# Patient Record
Sex: Male | Born: 1945 | Race: White | Hispanic: No | Marital: Married | Smoking: Former smoker
Health system: Southern US, Community
[De-identification: ages and names within clinical notes are randomized; demographics above are authoritative.]

## PROBLEM LIST (undated history)

## (undated) DIAGNOSIS — I10 Essential (primary) hypertension: Secondary | ICD-10-CM

## (undated) DIAGNOSIS — I509 Heart failure, unspecified: Secondary | ICD-10-CM

## (undated) DIAGNOSIS — I209 Angina pectoris, unspecified: Secondary | ICD-10-CM

## (undated) DIAGNOSIS — G709 Myoneural disorder, unspecified: Secondary | ICD-10-CM

## (undated) DIAGNOSIS — I219 Acute myocardial infarction, unspecified: Secondary | ICD-10-CM

## (undated) DIAGNOSIS — I2510 Atherosclerotic heart disease of native coronary artery without angina pectoris: Secondary | ICD-10-CM

## (undated) DIAGNOSIS — E119 Type 2 diabetes mellitus without complications: Secondary | ICD-10-CM

## (undated) HISTORY — PX: OTHER SURGICAL HISTORY: SHX169

## (undated) HISTORY — PX: CORONARY ANGIOPLASTY: SHX604

## (undated) HISTORY — PX: TOE AMPUTATION: SHX809

## (undated) HISTORY — PX: UPPER GI ENDOSCOPY: SHX6162

## (undated) HISTORY — PX: TONSILLECTOMY: SUR1361

---

## 2013-10-04 ENCOUNTER — Other Ambulatory Visit: Payer: Self-pay | Admitting: Podiatry

## 2013-10-04 ENCOUNTER — Inpatient Hospital Stay: Payer: Self-pay | Admitting: Specialist

## 2013-10-04 LAB — COMPREHENSIVE METABOLIC PANEL
Albumin: 3.1 g/dL — ABNORMAL LOW (ref 3.4–5.0)
Anion Gap: 10 (ref 7–16)
BUN: 27 mg/dL — ABNORMAL HIGH (ref 7–18)
EGFR (African American): 51 — ABNORMAL LOW
EGFR (Non-African Amer.): 44 — ABNORMAL LOW
Glucose: 129 mg/dL — ABNORMAL HIGH (ref 65–99)
Osmolality: 273 (ref 275–301)
SGOT(AST): 33 U/L (ref 15–37)
SGPT (ALT): 31 U/L (ref 12–78)
Sodium: 133 mmol/L — ABNORMAL LOW (ref 136–145)
Total Protein: 7.5 g/dL (ref 6.4–8.2)

## 2013-10-04 LAB — CBC WITH DIFFERENTIAL/PLATELET
Basophil #: 0 10*3/uL (ref 0.0–0.1)
Basophil %: 0.2 %
Eosinophil #: 0 10*3/uL (ref 0.0–0.7)
Eosinophil %: 0 %
HGB: 13 g/dL (ref 13.0–18.0)
Lymphocyte %: 3.4 %
MCH: 28.1 pg (ref 26.0–34.0)
MCHC: 33.4 g/dL (ref 32.0–36.0)
MCV: 84 fL (ref 80–100)
Neutrophil %: 94.7 %
Platelet: 288 10*3/uL (ref 150–440)
RBC: 4.61 10*6/uL (ref 4.40–5.90)

## 2013-10-05 LAB — BASIC METABOLIC PANEL
Anion Gap: 4 — ABNORMAL LOW (ref 7–16)
BUN: 34 mg/dL — ABNORMAL HIGH (ref 7–18)
Calcium, Total: 8.1 mg/dL — ABNORMAL LOW (ref 8.5–10.1)
Chloride: 103 mmol/L (ref 98–107)
Creatinine: 2.25 mg/dL — ABNORMAL HIGH (ref 0.60–1.30)
Creatinine: 1.62 mg/dL — ABNORMAL HIGH (ref 0.60–1.30)
EGFR (African American): 34 — ABNORMAL LOW
EGFR (African American): 51 — ABNORMAL LOW
EGFR (Non-African Amer.): 29 — ABNORMAL LOW
Glucose: 134 mg/dL — ABNORMAL HIGH (ref 65–99)
Osmolality: 267 (ref 275–301)
Osmolality: 276 (ref 275–301)
Potassium: 3.7 mmol/L (ref 3.5–5.1)
Potassium: 3.7 mmol/L (ref 3.5–5.1)
Sodium: 128 mmol/L — ABNORMAL LOW (ref 136–145)
Sodium: 133 mmol/L — ABNORMAL LOW (ref 136–145)

## 2013-10-05 LAB — HEMOGLOBIN A1C: Hemoglobin A1C: 6 % (ref 4.2–6.3)

## 2013-10-05 LAB — CBC WITH DIFFERENTIAL/PLATELET
Eosinophil #: 0 10*3/uL (ref 0.0–0.7)
Eosinophil %: 0 %
HCT: 33.1 % — ABNORMAL LOW (ref 40.0–52.0)
HGB: 10.9 g/dL — ABNORMAL LOW (ref 13.0–18.0)
Lymphocyte #: 0.6 10*3/uL — ABNORMAL LOW (ref 1.0–3.6)
Lymphocyte %: 4.1 %
MCH: 27.6 pg (ref 26.0–34.0)
MCV: 83 fL (ref 80–100)
Monocyte %: 2 %
Neutrophil #: 12.6 10*3/uL — ABNORMAL HIGH (ref 1.4–6.5)
Neutrophil %: 93.5 %
Platelet: 238 10*3/uL (ref 150–440)
RDW: 15.4 % — ABNORMAL HIGH (ref 11.5–14.5)

## 2013-10-05 LAB — LIPID PANEL
Triglycerides: 96 mg/dL (ref 0–200)
VLDL Cholesterol, Calc: 19 mg/dL (ref 5–40)

## 2013-10-06 DIAGNOSIS — R079 Chest pain, unspecified: Secondary | ICD-10-CM

## 2013-10-06 LAB — VANCOMYCIN, TROUGH: Vancomycin, Trough: 12 ug/mL (ref 10–20)

## 2013-10-06 LAB — TROPONIN I
Troponin-I: 0.02 ng/mL
Troponin-I: 0.16 ng/mL — ABNORMAL HIGH
Troponin-I: 0.22 ng/mL — ABNORMAL HIGH
Troponin-I: 0.14 ng/mL — ABNORMAL HIGH

## 2013-10-06 LAB — CBC WITH DIFFERENTIAL/PLATELET
Basophil #: 0 10*3/uL (ref 0.0–0.1)
Basophil %: 0.2 %
Eosinophil #: 0 10*3/uL (ref 0.0–0.7)
Eosinophil %: 0 %
HCT: 33.5 % — ABNORMAL LOW (ref 40.0–52.0)
HGB: 11.4 g/dL — ABNORMAL LOW (ref 13.0–18.0)
Lymphocyte #: 0.7 10*3/uL — ABNORMAL LOW (ref 1.0–3.6)
Lymphocyte %: 8.8 %
MCH: 28 pg (ref 26.0–34.0)
MCHC: 34 g/dL (ref 32.0–36.0)
MCV: 83 fL (ref 80–100)
Monocyte #: 0.4 x10 3/mm (ref 0.2–1.0)
Monocyte %: 5 %
Neutrophil #: 6.7 10*3/uL — ABNORMAL HIGH (ref 1.4–6.5)
Neutrophil %: 86 %
Platelet: 208 10*3/uL (ref 150–440)
RBC: 4.07 10*6/uL — ABNORMAL LOW (ref 4.40–5.90)
RDW: 15.3 % — ABNORMAL HIGH (ref 11.5–14.5)
WBC: 7.8 10*3/uL (ref 3.8–10.6)

## 2013-10-06 LAB — COMPREHENSIVE METABOLIC PANEL
Albumin: 2.1 g/dL — ABNORMAL LOW (ref 3.4–5.0)
Alkaline Phosphatase: 105 U/L
Anion Gap: 6 — ABNORMAL LOW (ref 7–16)
BUN: 26 mg/dL — ABNORMAL HIGH (ref 7–18)
Bilirubin,Total: 0.7 mg/dL (ref 0.2–1.0)
Calcium, Total: 8.1 mg/dL — ABNORMAL LOW (ref 8.5–10.1)
Chloride: 103 mmol/L (ref 98–107)
Co2: 26 mmol/L (ref 21–32)
Creatinine: 1.5 mg/dL — ABNORMAL HIGH (ref 0.60–1.30)
EGFR (African American): 55 — ABNORMAL LOW
EGFR (Non-African Amer.): 48 — ABNORMAL LOW
Glucose: 181 mg/dL — ABNORMAL HIGH (ref 65–99)
Osmolality: 279 (ref 275–301)
Potassium: 3.7 mmol/L (ref 3.5–5.1)
SGOT(AST): 59 U/L — ABNORMAL HIGH (ref 15–37)
SGPT (ALT): 47 U/L (ref 12–78)
Sodium: 135 mmol/L — ABNORMAL LOW (ref 136–145)
Total Protein: 6.2 g/dL — ABNORMAL LOW (ref 6.4–8.2)

## 2013-10-07 LAB — CBC WITH DIFFERENTIAL/PLATELET
Basophil #: 0 10*3/uL (ref 0.0–0.1)
Basophil %: 0.3 %
Eosinophil #: 0 10*3/uL (ref 0.0–0.7)
Eosinophil %: 0.1 %
HCT: 32.9 % — AB (ref 40.0–52.0)
HGB: 10.9 g/dL — ABNORMAL LOW (ref 13.0–18.0)
LYMPHS ABS: 2 10*3/uL (ref 1.0–3.6)
Lymphocyte %: 16.2 %
MCH: 27.2 pg (ref 26.0–34.0)
MCHC: 33.3 g/dL (ref 32.0–36.0)
MCV: 82 fL (ref 80–100)
MONOS PCT: 5.7 %
Monocyte #: 0.7 x10 3/mm (ref 0.2–1.0)
NEUTROS ABS: 9.4 10*3/uL — AB (ref 1.4–6.5)
NEUTROS PCT: 77.7 %
PLATELETS: 222 10*3/uL (ref 150–440)
RBC: 4.02 10*6/uL — ABNORMAL LOW (ref 4.40–5.90)
RDW: 15.6 % — ABNORMAL HIGH (ref 11.5–14.5)
WBC: 12.1 10*3/uL — AB (ref 3.8–10.6)

## 2013-10-07 LAB — BASIC METABOLIC PANEL
Anion Gap: 5 — ABNORMAL LOW (ref 7–16)
BUN: 30 mg/dL — ABNORMAL HIGH (ref 7–18)
CREATININE: 1.44 mg/dL — AB (ref 0.60–1.30)
Calcium, Total: 8.7 mg/dL (ref 8.5–10.1)
Chloride: 102 mmol/L (ref 98–107)
Co2: 30 mmol/L (ref 21–32)
EGFR (African American): 58 — ABNORMAL LOW
GFR CALC NON AF AMER: 50 — AB
Glucose: 100 mg/dL — ABNORMAL HIGH (ref 65–99)
Osmolality: 280 (ref 275–301)
Potassium: 3.5 mmol/L (ref 3.5–5.1)
SODIUM: 137 mmol/L (ref 136–145)

## 2013-10-07 LAB — PROTIME-INR
INR: 1.1
Prothrombin Time: 14.1 secs (ref 11.5–14.7)

## 2013-10-08 LAB — CBC WITH DIFFERENTIAL/PLATELET
Basophil #: 0.1 10*3/uL (ref 0.0–0.1)
Basophil %: 0.7 %
EOS ABS: 0.1 10*3/uL (ref 0.0–0.7)
EOS PCT: 0.9 %
HCT: 34.9 % — ABNORMAL LOW (ref 40.0–52.0)
HGB: 11.4 g/dL — AB (ref 13.0–18.0)
LYMPHS PCT: 20.1 %
Lymphocyte #: 2.1 10*3/uL (ref 1.0–3.6)
MCH: 27.2 pg (ref 26.0–34.0)
MCHC: 32.8 g/dL (ref 32.0–36.0)
MCV: 83 fL (ref 80–100)
MONO ABS: 0.9 x10 3/mm (ref 0.2–1.0)
MONOS PCT: 8 %
NEUTROS ABS: 7.4 10*3/uL — AB (ref 1.4–6.5)
NEUTROS PCT: 70.3 %
Platelet: 211 10*3/uL (ref 150–440)
RBC: 4.21 10*6/uL — ABNORMAL LOW (ref 4.40–5.90)
RDW: 15.3 % — AB (ref 11.5–14.5)
WBC: 10.6 10*3/uL (ref 3.8–10.6)

## 2013-10-08 LAB — BASIC METABOLIC PANEL
Anion Gap: 6 — ABNORMAL LOW (ref 7–16)
BUN: 27 mg/dL — AB (ref 7–18)
CO2: 28 mmol/L (ref 21–32)
Calcium, Total: 7.9 mg/dL — ABNORMAL LOW (ref 8.5–10.1)
Chloride: 105 mmol/L (ref 98–107)
Creatinine: 1.01 mg/dL (ref 0.60–1.30)
EGFR (African American): >60
EGFR (Non-African Amer.): >60
Glucose: 146 mg/dL — ABNORMAL HIGH (ref 65–99)
OSMOLALITY: 285 (ref 275–301)
Potassium: 3.4 mmol/L — ABNORMAL LOW (ref 3.5–5.1)
Sodium: 139 mmol/L (ref 136–145)

## 2013-10-08 LAB — CK: CK, TOTAL: 65 U/L (ref 35–232)

## 2013-10-09 LAB — CBC WITH DIFFERENTIAL/PLATELET
Basophil #: 0.1 10*3/uL (ref 0.0–0.1)
Basophil %: 0.6 %
Eosinophil #: 0.3 10*3/uL (ref 0.0–0.7)
Eosinophil %: 2.4 %
HCT: 37.2 % — ABNORMAL LOW (ref 40.0–52.0)
HGB: 12.4 g/dL — AB (ref 13.0–18.0)
LYMPHS PCT: 30.3 %
Lymphocyte #: 3.2 10*3/uL (ref 1.0–3.6)
MCH: 27.6 pg (ref 26.0–34.0)
MCHC: 33.4 g/dL (ref 32.0–36.0)
MCV: 83 fL (ref 80–100)
MONOS PCT: 10.3 %
Monocyte #: 1.1 x10 3/mm — ABNORMAL HIGH (ref 0.2–1.0)
Neutrophil #: 6 10*3/uL (ref 1.4–6.5)
Neutrophil %: 56.4 %
Platelet: 248 10*3/uL (ref 150–440)
RBC: 4.5 10*6/uL (ref 4.40–5.90)
RDW: 15.4 % — AB (ref 11.5–14.5)
WBC: 10.7 10*3/uL — ABNORMAL HIGH (ref 3.8–10.6)

## 2013-10-09 LAB — BASIC METABOLIC PANEL
Anion Gap: 5 — ABNORMAL LOW (ref 7–16)
BUN: 23 mg/dL — AB (ref 7–18)
CHLORIDE: 103 mmol/L (ref 98–107)
Calcium, Total: 8.9 mg/dL (ref 8.5–10.1)
Co2: 30 mmol/L (ref 21–32)
Creatinine: 1.18 mg/dL (ref 0.60–1.30)
EGFR (Non-African Amer.): >60
Glucose: 137 mg/dL — ABNORMAL HIGH (ref 65–99)
OSMOLALITY: 282 (ref 275–301)
Potassium: 3.9 mmol/L (ref 3.5–5.1)
SODIUM: 138 mmol/L (ref 136–145)

## 2013-10-09 LAB — CULTURE, BLOOD (SINGLE)

## 2013-10-10 LAB — PATHOLOGY REPORT

## 2013-10-10 LAB — CULTURE, BLOOD (SINGLE)

## 2013-10-11 LAB — CULTURE, BLOOD (SINGLE)

## 2013-10-12 LAB — WOUND CULTURE

## 2013-10-13 ENCOUNTER — Encounter: Payer: Self-pay | Admitting: Surgery

## 2013-10-18 LAB — WOUND CULTURE

## 2014-11-26 IMAGING — CR DG CHEST 1V PORT
1 series · 1 of 1 positions shown · non-contrast
Comparison: None.

CLINICAL DATA: Central line placement.

EXAM:
PORTABLE CHEST - 1 VIEW

[ap]
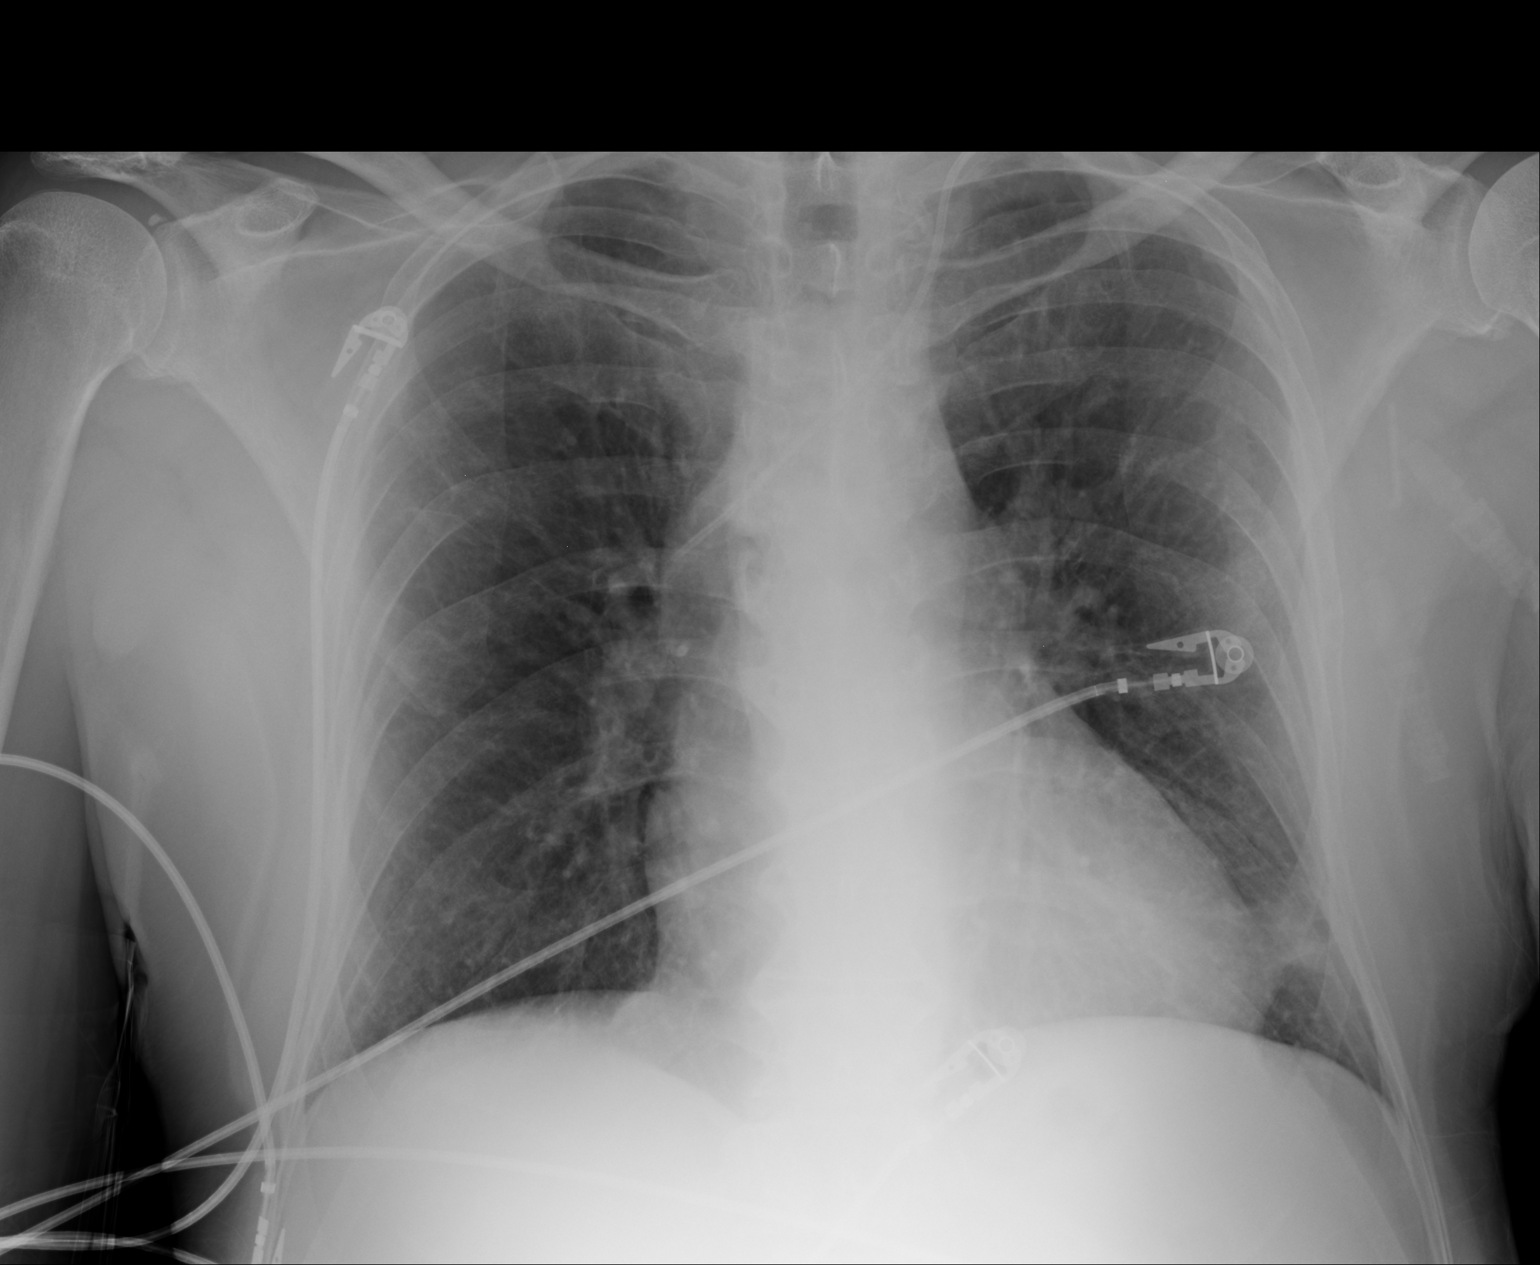

[1 of 1 positions shown; findings below may reference images not displayed]

FINDINGS: Left internal jugular catheter placed with tip overlying the mid SVC
region. Tip is directed somewhat laterally towards the vessel side
wall. No pneumothorax. The heart size and mediastinal contours are
within normal limits. Focal area of atelectasis or infiltration in
the left lung base. . The visualized skeletal structures are
unremarkable.
IMPRESSION: Left central venous catheter placed with tip over the upper SVC
region. Atelectasis or focal infiltration in the left lung base. No
pneumothorax.

## 2014-11-26 IMAGING — CR RIGHT FOOT COMPLETE - 3+ VIEW
1 series · 3 of 3 positions shown · non-contrast
Comparison: None.

CLINICAL DATA: Cellulitis of foot at 5th metatarsal. Pain,
swelling.

EXAM:
RIGHT FOOT COMPLETE - 3+ VIEW

[Series 1: ap · 0.17mm/px · 3 of 3 slices shown]
[im 1/3]
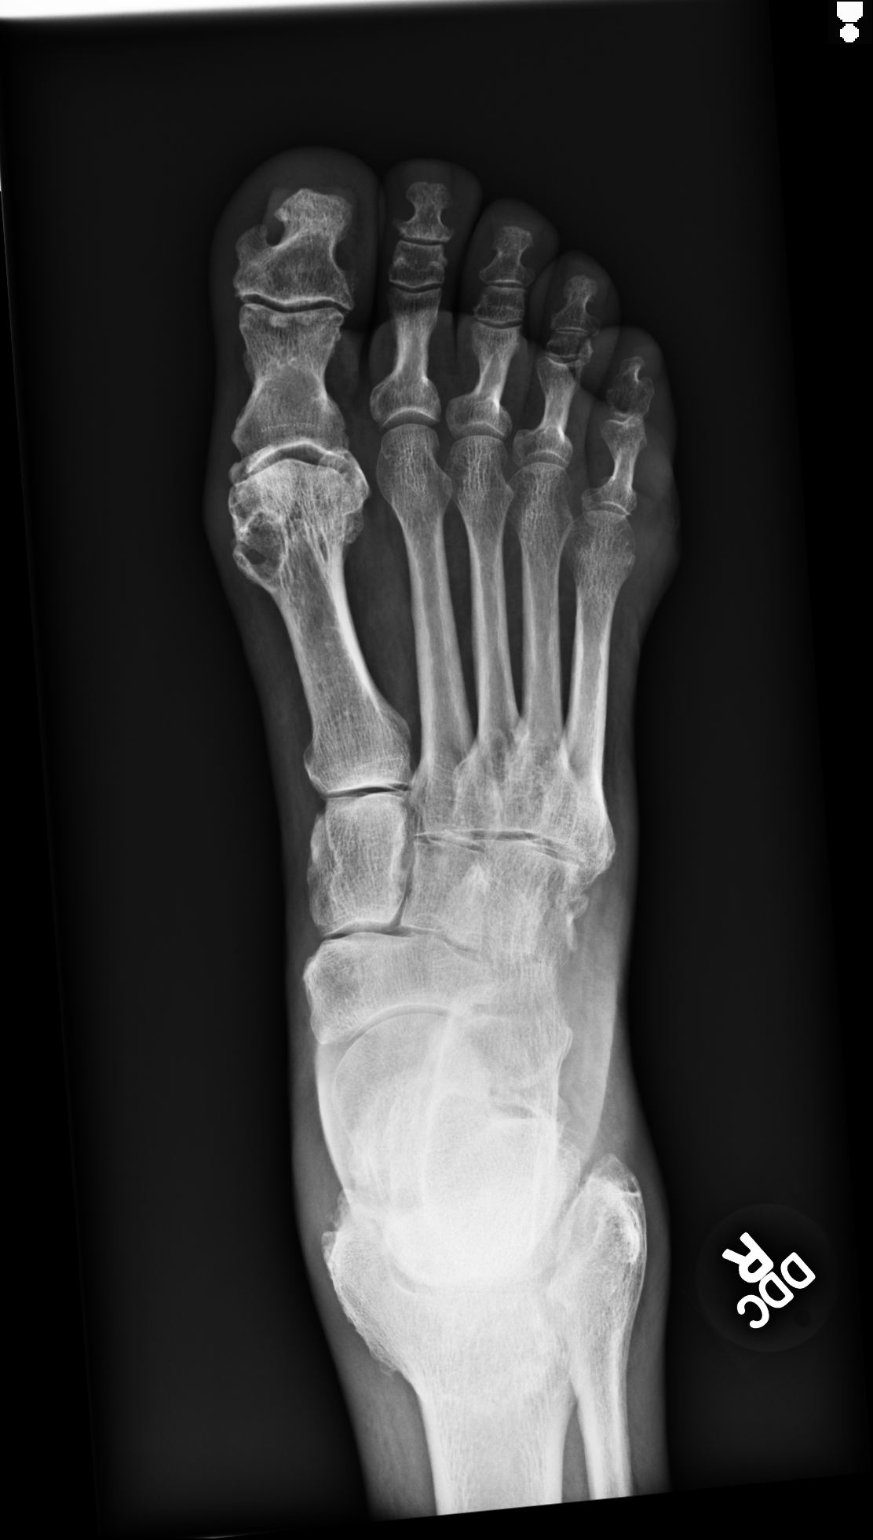
[im 2/3]
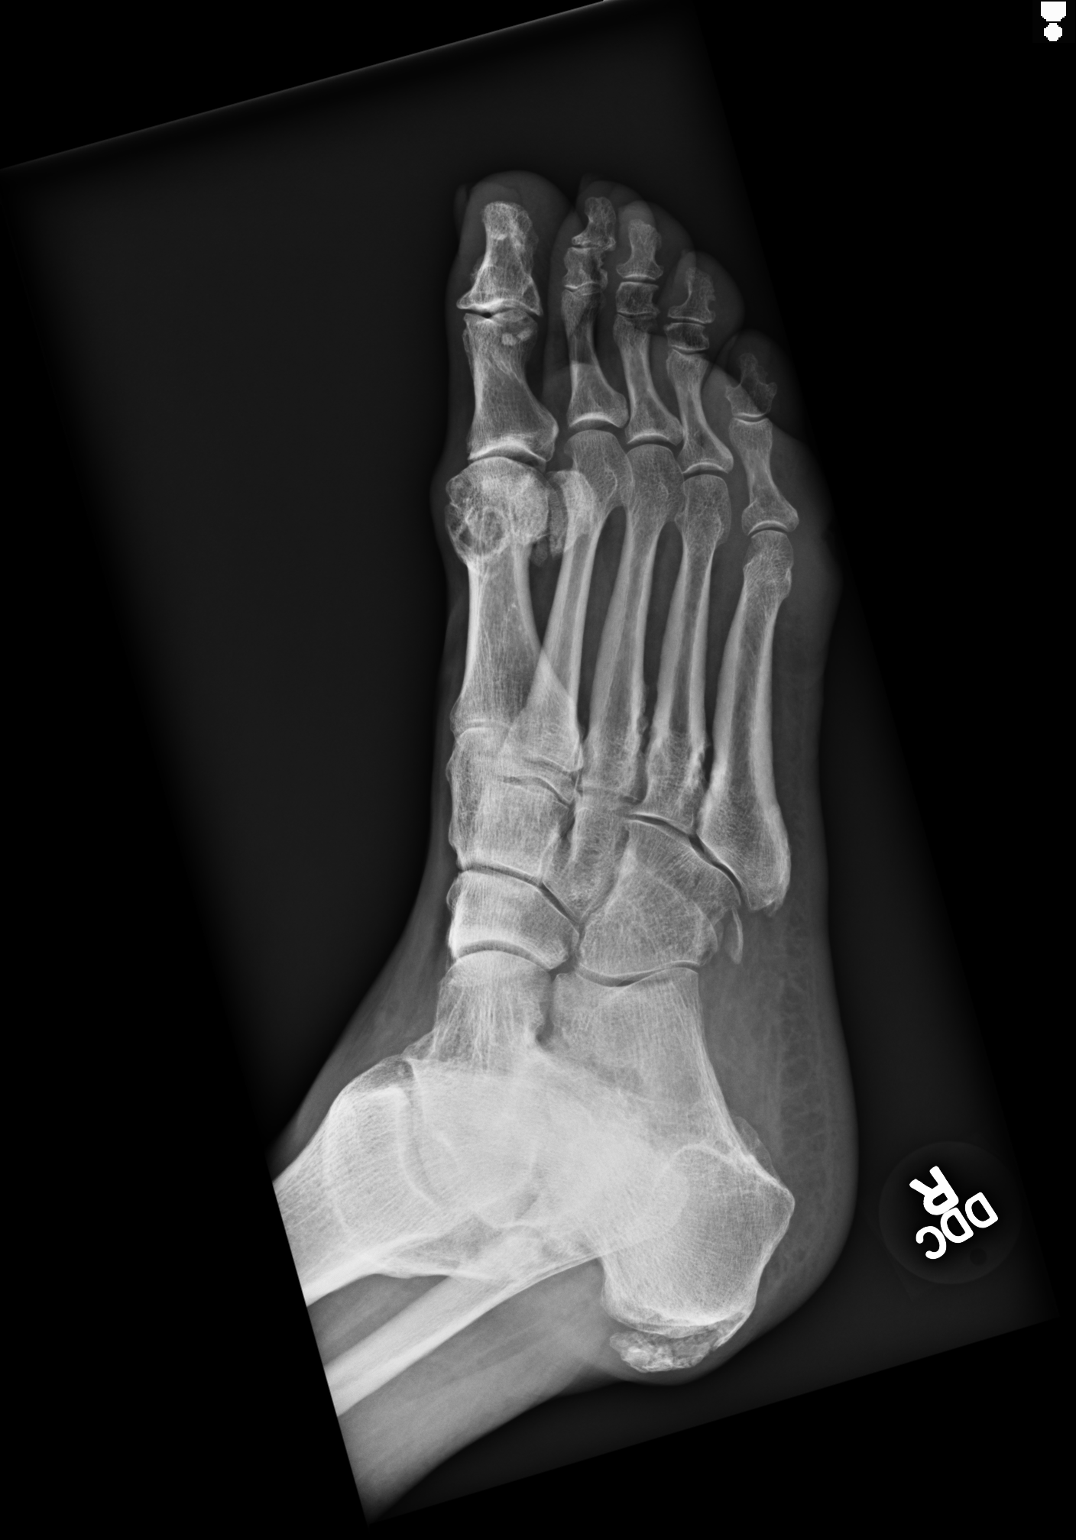
[im 3/3]
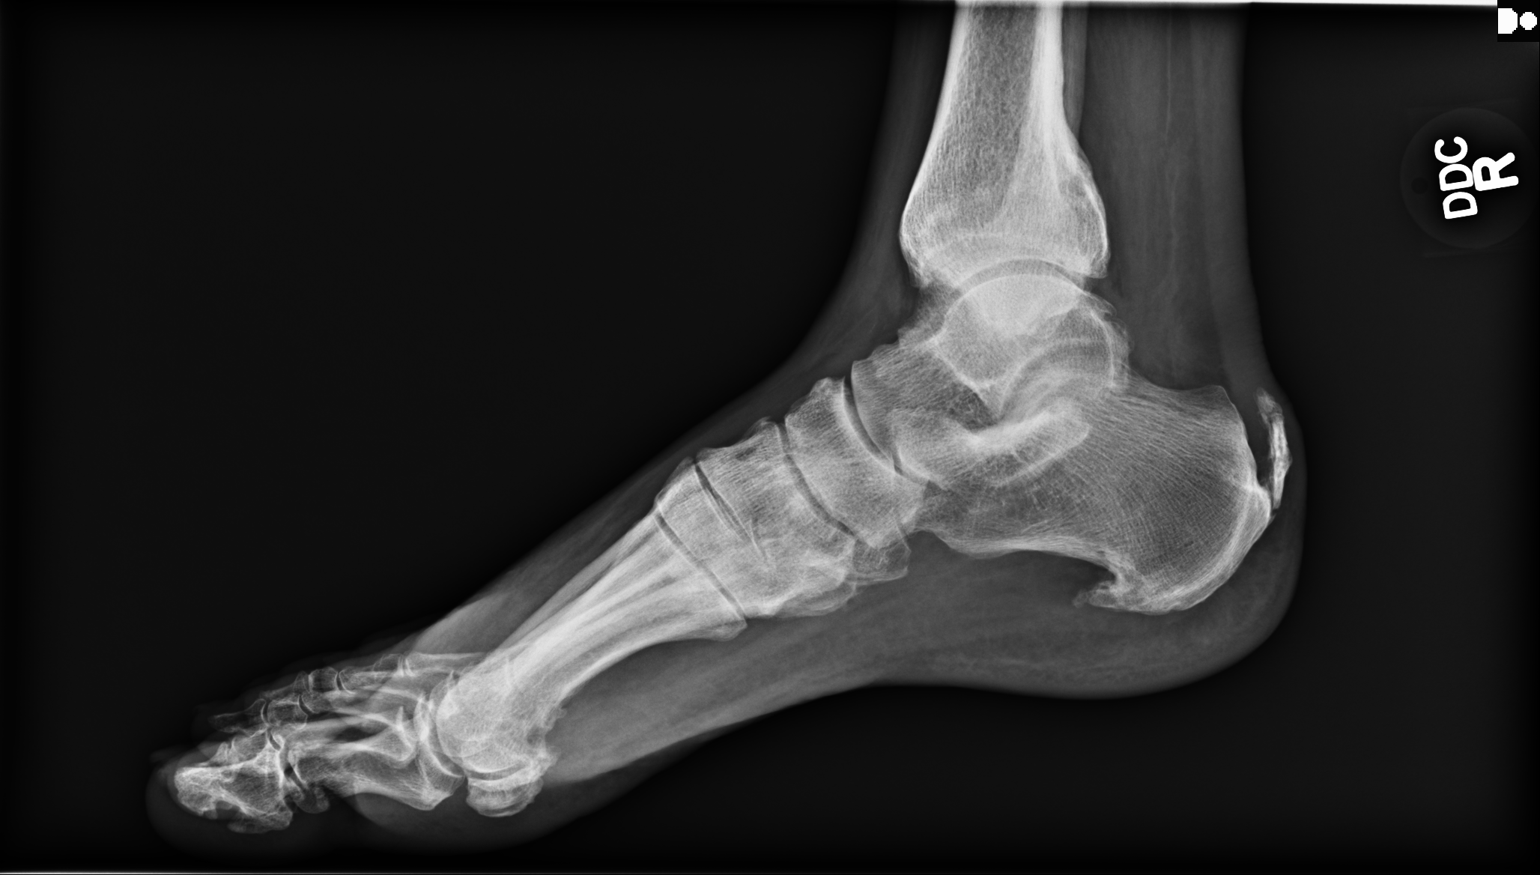

[3 of 3 positions shown; findings below may reference images not displayed]

FINDINGS: Soft tissue swelling over the right 5th MTP joint region. No
underlying acute bony abnormality. No fracture, subluxation or
dislocation. No radiographic changes of osteomyelitis.

Advanced degenerative changes at the 1st MTP joint. Large
subchondral cyst in the distal aspect of the right 1st metatarsal.
IMPRESSION: No radiographic changes of osteomyelitis.  No acute findings.

## 2015-01-26 NOTE — Consult Note (Signed)
Brief Consult Note: Diagnosis: osteomyelitis right 5th met.   Patient was seen by consultant.   Consult note dictated.   Recommend to proceed with surgery or procedure.   Recommend further assessment or treatment.   Orders entered.   Discussed with Attending MD.   Comments: If pt is stable will do 5th ray amp. on FridAY.  mri TOMORROW.  Electronic Signatures: Perry Mount (MD)  (Signed 31-Dec-14 17:46)  Authored: Brief Consult Note   Last Updated: 31-Dec-14 17:46 by Perry Mount (MD)

## 2015-01-26 NOTE — Consult Note (Signed)
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   Patient was seen by consultant.   Recommend to proceed with surgery or procedure.   Comments: will given bicarb and plan for angiogram.  Electronic Signatures: Levora DredgeSchnier, Chondra Boyde (MD)  (Signed 31-Dec-14 10:08)  Authored: Brief Consult Note   Last Updated: 31-Dec-14 10:08 by Levora DredgeSchnier, Javonn Gauger (MD)

## 2015-01-27 NOTE — Consult Note (Signed)
PATIENT NAME:  Ernest Carpenter, Ernest Carpenter DATE OF BIRTH:  1945/12/20  DATE OF CONSULTATION:  10/05/2013  CONSULTING PHYSICIAN:  Rhona RaiderMatthew G. Jadine Brumley, DPM  REASON FOR CONSULTATION: Infection right foot, history of diabetes, history of diabetic ulcer.  He is a 69 year old male who was admitted to the hospital yesterday. He was actually seen in our office by Dr. Ether GriffinsFowler and by myself on the 30th. He had significant redness, cellulitis and was febrile with chills at that time timeframe. He was sent to the ER and was admitted. I am consulted for work-up on possible osteomyelitis to the fifth metatarsal region.   PAST MEDICAL HISTORY:  Diabetes, hypertension, hyperlipidemia, coronary artery disease status post stent placement in '99 and 2000 and [facility]. PVD, for which he received an angioplasty and stenting yesterday and history of diabetic neuropathy.   PAST SURGICAL HISTORY:  Includes catheterizations.  No other surgical history at present.   ALLERGIES: NKDA.  CURRENT MEDICATIONS:  Include: Omeprazole 20 mg daily, metformin 1000 mg b.i.d., hydrochlorothiazide and lisinopril 25/20 mg 1 tablet daily, glipizide 2 mg daily and gabapentin 600 mg b.i.d., Plavix 75 mg once a day, atorvastatin 25 mg once a day, atenolol 25 mg once a day and aspirin 325 mg once a day.   SOCIAL HISTORY:  Includes remote history of smoking, quit in 2000. Denies EtOH and illicits.   FAMILY HISTORY: Positive for heart disease, diabetes and hypertension.   PHYSICAL EXAMINATION  GENERAL:  He is an alert, well-oriented, 69 year old male, pleasant, well oriented. VITAL SIGNS:  Include:  Pulse 98. His respirations 22, blood pressure 108/53, pulse ox is 97. LOWER EXTREMITY:  Shows ulcerative changes with drainage of cellulitis infection to the right fifth metatarsophalangeal joint region. This is a deep penetrating ulcer with the potential for osteomyelitis in the region, and this will need incision and  drainage once we get him stabilized. He is in CCU right now and wait for his kidney function to get a little bit better. He was notably febrile when he came in, but the hard chills have receded at this point and he feels much better today. I will try to order an MRI for tomorrow and also consider surgery on Friday to see if we get things stabilized for him.   ____________________________ Rhona RaiderMatthew G. Griffin Dewilde, DPM mgt:ce D: 10/05/2013 17:50:57 ET T: 10/05/2013 18:23:53 ET JOB#: [id number]  cc: Rhona RaiderMatthew G. Anesha Hackert, DPM, <Dictator> Epimenio SarinMATTHEW G Yazmine Sorey MD ELECTRONICALLY SIGNED 10/18/2013 16:11

## 2015-01-27 NOTE — Consult Note (Signed)
Brief Consult Note: Diagnosis: Pt with cad s/p pci at [facility] followed by dr. Markham Jordanjames jollis until several years ago who was asdmitted with gangrenous toe. He had revaslularisation and surgical resection on toe. Has some sob and cp this am during breathing treratment. Trivial troponin eelvation.   Patient was seen by consultant.   Recommend further assessment or treatment.   Comments: 69 yo male with history of cad s/p pci who is s/p right toe resection and right lower extremety revascularisation. Recieved iv fluids with procedure resulting in some volume overload. Aggressivly diuresed for this and had chest pain briefly during breathing treatment today. Trivial troponin elevation to 0.22. No ekg changes. No further pain. Likely secondary to demand. No clinical evidence of active ischemia. Continue on current meds and will review echo when available.  Electronic Signatures: Dalia HeadingFath, Bingham Millette A (MD)  (Signed 02-Jan-15 15:54)  Authored: Brief Consult Note   Last Updated: 02-Jan-15 15:54 by Dalia HeadingFath, Qaadir Kent A (MD)

## 2015-01-27 NOTE — H&P (Signed)
PATIENT NAME:  Ernest Carpenter, Rajinder W MR#:  [id number] DATE OF BIRTH:  1946/01/28  DATE OF ADMISSION:  10/04/2013  PRIMARY CARE PHYSICIAN: Barry BrunnerGlenn Willett, MD  REFERRING PHYSICIAN:  Sharyn CreamerMark Quale, MD  CHIEF COMPLAINT: Right foot pain and black 5th toe.   HISTORY OF PRESENT ILLNESS: Ernest Carpenter is a 69 year old pleasant white male, with past medical history of hypertension, hyperlipidemia, coronary artery disease, multiple stents and angioplasties done in the past, who presented to the Emergency Department with the complaint  of pain in the right foot. The patient had a plantar site ulcer about 5 weeks back. Has been having severe pain in the right foot. Concerning this, was given Neurontin and doxycycline without much improvement.   For the last 2 days, noticed to have black discoloration of the 5th toe. Concerning this, he went to the primary care physician, who referred the patient to the Emergency Department. The patient has been experiencing subjective fevers and a low-grade fever of 99.   Per the patient, had workup done for his peripheral vascular disease but could not tell the type of workup. On workup in the Emergency Department, the patient has WBC of 11.8, hemoglobin 13. He has elevated BUN and creatinine from the baseline. Per patient, usually blood sugars and the blood pressure are well-controlled.   PAST MEDICAL HISTORY:  1.  Hypertension.  2.  Hyperlipidemia.  3.  Diabetes mellitus, on oral medication.  4.  Coronary artery disease status post stent placement in 1999 and 2000 and [facility]oplast[facility].  5.  Peripheral vascular disease. 6.  Diabetic neuropathy.   PAST SURGICAL HISTORY: Other than heart catheterization, no other procedures were done.   ALLERGIES: No known drug allergies.   HOME MEDICATIONS:  1.  Omeprazole 20 mg once a day.   2.  Metformin 1000 mg two times a day.  3.  Hydrochlorothiazide and lisinopril 25/20 mg one tablet once a day.  4.  Glipizide 2 mg once a  day.  5.  Gabapentin 600 mg two times a day.  6.  Plavix 75 mg once a day.  7.  Atorvastatin  25 mg once a day.  8.  Atenolol 25 mg once a day.  9.  Aspirin 325 mg once a day.     SOCIAL HISTORY: Smoked in the past, quit in 2000 and denies drinking alcohol or using illicit drugs. Retired.       FAMILY HISTORY: Strong family history of coronary artery disease, diabetes mellitus, hypertension in father. CVA in mother.    REVIEW OF SYSTEMS: CONSTITUTIONAL: Has been experiencing generalized weakness.  EYES: No change in vision.  ENT: No change in hearing. No sore throat.  RESPIRATORY: No cough, shortness of breath.    CARDIOVASCULAR: No chest pain, palpitations.  GASTROINTESTINAL: Somewhat poor appetite. Regular bowel movements.  GENITOURINARY: No dysuria or hematuria.  SKIN: Has redness and swelling of the right foot and discoloration, otherwise, no other rashes.  MUSCULOSKELETAL: No joint pains and aches.  NEUROLOGIC: Has neuropathy.  There is no other weakness in any part of the body.   PHYSICAL EXAMINATION:  GENERAL: A well-built, well-nourished, age-appropriate male lying down in the bed, not in distress.  VITAL SIGNS: Temperature 98.8, pulse 97, blood pressure 121/75, respiratory rate of 16, oxygen saturation is 97% on room air.  HEENT: Head normocephalic, atraumatic. No sclerae icterus. Conjunctivae normal. Pupils equal and react to light. Extraocular movements are intact. Mucous membranes moist. No pharyngeal erythema.  NECK: Supple. No lymphadenopathy. No  JVD. No carotid bruit. No thyromegaly.  CHEST: Has no focal tenderness.  LUNGS: Bilaterally clear to auscultation.  HEART: S1, S2 regular. No murmurs are heard.  ABDOMEN: Bowel sounds present. Soft, nontender, nondistended. No hepatosplenomegaly.  EXTREMITIES: Right foot has redness of the 5th toe involving the distal one third aspect and plantar aspect with a plantar ulcer with tenderness to palpation. The right 5th toe is  blackish discoloration. Could not appreciate the pulse in the dorsalis pedis.  NEUROLOGIC: Alert, oriented to place, person and time. Cranial nerves II through XII intact. Motor 5/5 in upper and lower extremities.   LABORATORIES: CMP: BUN 27, creatinine of 1.6. The rest of the values are within normal limits.   CBC: WBC of 11.8, hemoglobin 13, platelet count of 288.   Right foot: No signs of any osteomyelitis.   ASSESSMENT AND PLAN: Ernest Carpenter is a 69 year old male who comes to the Emergency Department with redness and swelling of the right foot for the last 5 weeks and discoloration of the toe for the last 2 days.  1.  Ischemic toe. Very highly concerning for the patient's peripheral vascular disease and distal disease. We will obtain ABI and arterial Dopplers to ensure the patient has a good blood flow. We will involve orthopedic surgery. Keep the patient on pain control.  2.  Sepsis, caused by the cellulitis. Keep the patient on vancomycin and Zosyn. Obtain blood cultures.  3.  Diabetes mellitus. Will obtain hemoglobin A1c. Will hold the oral medication. Keep the patient on insulin.  4.  Hypertension. Will continue with home medications.  5.  Coronary artery disease, status post stent and angioplasties done. Continue with the aspirin and Plavix.  6.  Ischemic foot. The patient has discoloration for the last 2 days without benefit from heparin drip at this time  7.  Will keep the patient on deep vein thrombosis prophylaxis with Lovenox.   TIME SPENT: 50 minutes.   ____________________________ Susa Griffins, MD pv:np D: 10/04/2013 21:07:17 ET T: 10/04/2013 22:34:43 ET JOB#: [id number]  cc: Susa Griffins, MD, <Dictator> Jorje Guild. Beckey Downing, MD  Susa Griffins MD ELECTRONICALLY SIGNED 10/14/2013 21:21

## 2015-01-27 NOTE — Op Note (Signed)
PATIENT NAME:  Ernest Carpenter, Ernest Carpenter DATE OF BIRTH:  12/26/1945  DATE OF PROCEDURE:  10/05/2013  PREOPERATIVE DIAGNOSES: 1.  Ischemia, right foot.  2.  Atherosclerotic occlusive disease, bilateral lower extremities, with ulceration of the right fifth toe.  3.  Diabetes mellitus.  4.  Hypertension.  5.  Cardiovascular disease.   POSTOPERATIVE DIAGNOSES: 1.  Ischemia, right foot.  2.  Atherosclerotic occlusive disease, bilateral lower extremities, with ulceration of the right fifth toe.  3.  Diabetes mellitus.  4.  Hypertension.  5.  Cardiovascular disease.   PROCEDURES PERFORMED: 1.  Abdominal aortogram.  2.  Right lower extremity distal runoff, third order catheter placement.  3.  Percutaneous transluminal angioplasty with a 6 mm Lutonix balloon for treatment of critical stenosis within the right superficial femoral artery.   SURGEON: Renford DillsGregory G. Dimetrius Montfort, MD  SEDATION: Versed 2 mg plus fentanyl 200 mcg administered IV. Continuous ECG, pulse oximetry and cardiopulmonary monitoring was performed throughout the entire procedure by the interventional radiology nurse. Total sedation time was 1 hour.   ACCESS: A 6-French sheath left common femoral artery.   FLUOROSCOPY TIME: 5.1 minutes.   CONTRAST USED: Isovue 60 mL.   INDICATIONS: [name] is a 69 year old gentleman who presents to the hospital with increasing pain and ischemic changes of his right foot. He also has a nonhealing ulceration of the right fifth ray. The risks and benefits for angiography and possible intervention were reviewed. All questions are answered. The patient has agreed to proceed.   DESCRIPTION OF PROCEDURE: The patient is taken to special procedures and placed in the supine position. After adequate sedation is achieved, both groins are prepped and draped in sterile fashion. Ultrasound is placed in a sterile sleeve. Common femoral artery is identified on the left.  It is echolucent and pulsatile,  indicating patency. Image is recorded. Then 1% lidocaine is infiltrated in the soft tissues and under direct ultrasound visualization microneedle is inserted into the anterior wall of the common femoral artery, microwire followed by microsheath, J-wire followed by a 5-French sheath and 5-French pigtail catheter. The pigtail catheter is positioned at the level of T12 and AP projection of the aorta is obtained. Pigtail catheter is moved to above the bifurcation and LAO projection of the pelvis is obtained.   A stiff-angled Glidewire and pigtail catheter are then used to cross the aortic bifurcation. The catheter is advanced down into the superficial femoral artery where an RAO projection of the groin is obtained. The AP projection of the SFA and down through the trifurcation is obtained. Distal images to the foot are inadequate. Stiff-angled Glidewire is reintroduced and negotiated down to the level of the mid popliteal and the 5-French sheath is exchanged for a 6-French Raby. Raby is positioned with its tip in the proximal SFA.  A straight slip catheter is negotiated over the Glidewire down to the mid popliteal where hand injection of contrast is used to finish distal runoff. It also confirms intraluminal placement. Magic torque wire is then exchanged for the Glidewire and a 6 x 60 Lutonix balloon is selected, advanced across the SFA lesion. It is inflated to 10 atmospheres for 3 full minutes. Followup angiography demonstrates a widely patent artery which matches the native artery quite nicely. There is a small, very minor non-flow-limiting dissection proximally. Distal runoff is preserved. The sheath is then pulled back to the left external iliac. Oblique view is obtained and a StarClose device deployed successfully. There are no immediate complications.  INTERPRETATION: The abdominal aorta is opacified with a bolus injection of contrast. There are no hemodynamically significant stenoses within the aorta,  bilateral common and bilateral external iliac arteries. There are bilateral nephrograms, normal size, single renal arteries. No evidence of renal artery stenosis bilaterally.   The right common femoral and profunda femoris are widely patent.  The proximal 2/3 of the superficial femoral artery is widely patent as well. At [facility] canal, there is a 2 to 3 cm segment of greater than 90% stenosis. The mid popliteal and below-knee popliteal is widely patent and there is 2-vessel runoff to the foot via the posterior tibial, which is dominant, and the peroneal. The anterior tibial occludes shortly after its origin.   Following angioplasty as described above, there is wide patency of the superficial femoral artery now. There is a very minor insignificant dissection noted.   SUMMARY: Successful revascularization of the right lower extremity as described above.   ____________________________ Renford Dills, MD ggs:cs D: 10/05/2013 19:48:23 ET T: 10/05/2013 20:51:23 ET JOB#: [id number]  cc: Renford Dills, MD, <Dictator> Rhona Raider. Troxler, DPM Jorje Guild. Beckey Downing, MD Renford Dills MD ELECTRONICALLY SIGNED 10/11/2013 19:30

## 2015-01-27 NOTE — Consult Note (Signed)
Consult for ARF  S Cr has been improving and is in normal range please re-consult as necessary  Electronic Signatures: Mosetta PigeonSingh, Gohan Collister (MD)  (Signed on 03-Jan-15 10:55)  Authored  Last Updated: 03-Jan-15 10:55 by Mosetta PigeonSingh, Florie Carico (MD)

## 2015-01-27 NOTE — Op Note (Signed)
PATIENT NAME:  Ernest Carpenter, Ernest Carpenter MR#:  [id number] DATE OF BIRTH:  1946/05/30  DATE OF PROCEDURE:  10/07/2013  PREOPERATIVE DIAGNOSIS:  Gangrene, right fifth toe; osteomyelitis, right fifth metatarsal.   POSTOPERATIVE DIAGNOSIS:  Gangrene, right fifth toe; osteomyelitis, right fifth metatarsal.   PROCEDURE:  Fifth ray amputation of right foot.   SURGEON:  Epimenio SarinMatthew G Kenley Troop, DPM.   ASSISTANT:  None.   HISTORY OF PRESENT ILLNESS:  The patient has had pain and discomfort to the right fifth toe fifth ray for quite a while. He had gangrenous changes develop over the last few weeks to the right fifth toe. He developed cellulitis, inflammation, swelling, septicemia and has been in the CCU for the last couple of days. He has been changed to the floor as of late yesterday. He had a diabetic ulceration, subnet 5. It was fairly chronic and likely developed osteo from that region. Gangrene ensued into the fifth toe. His cellulitis looks a lot better since he has been on the antibiotics.   CLINICAL IMPRESSION:  Fifth toe gangrene with osteomyelitis, fifth metatarsal head, right foot.   ANESTHESIA:  MAC with local anesthesia.   ESTIMATED BLOOD LOSS:  Approximately 50 mL.      OPERATIVE REPORT:  The patient was brought to the OR and placed on the OR table in the supine position. At this point after MAC was achieved with local anesthesia given by me, the patient was prepped and draped in the usual sterile manner. At this time an incision was made in a somewhat of an elliptical process around the fifth toe. I tried to preserve all viable tissue. There was some necrotic tissue associated with the ulcer and the fifth toe was gangrenous. The healthy margins were identified and incised at that point. The incision was carried down to deep tissue to the MTP joint and the fifth toe was amputated and sent to Pathology for evaluation. At this point approximately the distal half of the fifth metatarsal was resected. There  was noted bone discoloration, death, infection at the metatarsal head. This was cultured and a sample also that was sent to Pathology for evaluation separately. The area was then copiously irrigated. All infected tissue was removed. The pulse lavage system was used to really irrigate out and clean it out nicely. Once this was achieved and all necrotic and infected tissue was removed, the incision was closed up proximally with 3-0 Vicryl simple interrupted suture. The area was closed distally as well and I was able to get mostly primary skin-to-skin closure with the exception dorsally. I left an area to pack with iodoform gauze. Once this was packed, the wound was dressed with sterile bandaging, 4 x 4s, and Steri-Strips across the incision margin to ease some of the pressure on it and ABD pads along with Kerlix and gauze wraps. The patient appeared to tolerate the procedure and anesthesia well and left the OR for the Recovery Room with vital signs stable and neurovascular status intact.   ____________________________ Rhona RaiderMatthew G. Taelyn Nemes, DPM mgt:jm D: 10/07/2013 09:45:36 ET T: 10/07/2013 09:55:37 ET JOB#: [id number]  cc: Rhona RaiderMatthew G. Jejuan Scala, DPM, <Dictator> Epimenio SarinMATTHEW G Yeira Gulden MD ELECTRONICALLY SIGNED 10/18/2013 16:11

## 2015-01-27 NOTE — Discharge Summary (Signed)
PATIENT NAME:  Ernest Carpenter, Ernest Carpenter DATE OF BIRTH:  1946/08/27  DATE OF ADMISSION:  10/04/2013 DATE OF DISCHARGE:  10/09/2013  For a detailed note, please see the history and physical on admission by Dr. Heron NayVasireddy.   DISCHARGE DIAGNOSES: As follows: Osteomyelitis of the right fifth toe, status post amputation.  Sepsis secondary to osteomyelitis secondary to strep agalactiae. Hypertension. Hyperlipidemia. Diabetes. Diabetic neuropathy.   DIET: The patient is being discharged home on an American Diabetic Association low-sodium, low-fat diet.   ACTIVITY: As tolerated.   FOLLOWUP: With Dr. Barry BrunnerGlenn Willett and Dr. Recardo EvangelistMatthew Troxler in the next 1 to 2 weeks.   DISCHARGE MEDICATIONS: Aspirin 325 mg daily, metformin 1000 mg b.i.d., lisinopril/HCTZ 1 tab daily 25/20, atenolol 25 mg daily, omeprazole 20 mg daily, atorvastatin 20 mg at bedtime, Plavix 75 mg daily, glimepiride 2 mg 1 tab b.i.d., gabapentin 600 mg b.i.d., ceftriaxone 2 grams IV daily times another 24 days, Tylenol with hydrocodone 5/325 one tab q.6 hours as needed.   CONSULTANTS DURING THE HOSPITAL COURSE: Dr. Levora DredgeGregory Schnier from vascular surgery, Dr. Recardo EvangelistMatthew Troxler from podiatry, Dr. Mariel KanskyKen Fath from cardiology.   PERTINENT STUDIES DONE DURING THE HOSPITAL COURSE: Are as follows: An x-ray of the right foot showing no radiographic changes of osteomyelitis, no acute findings. Chest x-ray done on admission showing no evidence of any acute cardiopulmonary disease. Ultrasound of the kidneys showing normal-sized kidneys, no evidence of hydronephrosis. Blood cultures to be positive for strep agalactiae. Wound cultures also to be positive for group B strep, strep agalactiae.   HOSPITAL COURSE: This is a 69 year old male with medical problems as mentioned above, presented to the hospital October 04, 2013, due to redness and swelling of his right great fifth toe and noted to have gangrene.  1.  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 On admission, the patient was thought to have an ischemic right toe. Therefore, a vascular surgery consult was obtained. The patient was seen by Dr. Levora DredgeGregory Schnier. The patient went to the OR the day after admission, underwent an angiogram and angioplasty of the right superficial femoral artery. Post procedure, the patient was also seen by podiatry, Dr. Orland Jarredroxler. He recommended doing a fifth great toe amputation which was done on the morning of October 07, 2013. Post procedure, the patient has been hemodynamically stable and afebrile and is clinically doing very well.  2.  Osteomyelitis of the right great toe. This was the clinical diagnosis on admission and was confirmed when the patient was taken to the OR by podiatry. This was likely secondary to strep agalactiae. Initially, the patient was treated with broad-spectrum IV antibiotics with vancomycin and Zosyn, eventually narrowed down to just meropenem. The patient likely needs long-term IV antibiotic therapy given his osteomyelitis. Therefore, he is being discharged on 4 weeks of IV antibiotic therapy. I have switched the patient from IV meropenem which a q.8 hour dosing to a 2 gram IV daily ceftriaxone which is more convenient. The patient's wound cultures from also the OR are consistent with group B strep.  3.  Acute renal failure. This was likely secondary to sepsis from the osteomyelitis. After getting IV fluids and as the patient's hemodynamics and infection have improved, his creatinine has now come back to baseline.  4.  Elevated troponin. This was likely secondary to demand ischemia without evidence of an acute coronary syndrome, secondary to sepsis. The patient was seen by cardiology, by Dr. Lady GaryFath, who did not think the patient  needed acute cardiac intervention. He did undergo echocardiogram which showed EF of 65% to 70% with no wall motion abnormalities.  5.  Persistent leukocytosis. This was secondary to the osteomyelitis and  infection. This has since then improved and normalized with IV antibiotic therapy.  6.  Diabetes. While in the hospital, since the patient had acute renal failure, he was maintained on just sliding scale insulin although he will resume his metformin and his glimepiride upon discharge.  7.  Diabetic neuropathy. The patient was maintained on his Neurontin. He will resume that upon discharge.  8.  Hyperlipidemia. The patient was maintained on his atorvastatin and he will resume that.  9.  Hypertension. Initially when the patient presented to hospital, he was hypotensive and septic. Therefore, all his antihypertensives were held.  His hemodynamics have significantly improved since admission though. The patient will resume his home antihypertensives including his lisinopril/HCTZ and his atenolol upon discharge.  10.  CODE STATUS: The patient is a full code.   DISPOSITION: He is being discharged with home health nursing services for management of his long-term IV antibiotics and wound care of his right foot. The patient is to keep the dressing dry until he sees podiatry next week.   TIME SPENT: 45 minutes.    ____________________________ Rolly Pancake. Cherlynn Kaiser, MD vjs:cs D: 10/09/2013 15:37:00 ET T: 10/09/2013 20:37:12 ET JOB#: [id number]  cc: Rolly Pancake. Cherlynn Kaiser, MD, <Dictator> Jorje Guild. Beckey Downing, MD Rhona Raider Troxler, DPM Houston Siren MD ELECTRONICALLY SIGNED 11/02/2013 11:19

## 2015-01-29 ENCOUNTER — Ambulatory Visit
Admit: 2015-01-29 | Disposition: A | Discharge status: Home / Self Care | Payer: Self-pay | Attending: Gastroenterology | Admitting: Gastroenterology

## 2015-02-13 ENCOUNTER — Encounter: Payer: Self-pay | Admitting: *Deleted

## 2015-02-13 ENCOUNTER — Encounter
Admission: RE | Disposition: A | Discharge status: Home / Self Care | Payer: Self-pay | Source: Ambulatory Visit | Attending: Gastroenterology

## 2015-02-13 ENCOUNTER — Ambulatory Visit: Payer: Medicare PPO | Admitting: Anesthesiology

## 2015-02-13 ENCOUNTER — Ambulatory Visit
Admission: RE | Admit: 2015-02-13 | Discharge: 2015-02-13 | Disposition: A | Discharge status: Home / Self Care | Payer: Medicare PPO | Source: Ambulatory Visit | Attending: Gastroenterology | Admitting: Gastroenterology

## 2015-02-13 DIAGNOSIS — K222 Esophageal obstruction: Secondary | ICD-10-CM | POA: Insufficient documentation

## 2015-02-13 DIAGNOSIS — K224 Dyskinesia of esophagus: Secondary | ICD-10-CM | POA: Insufficient documentation

## 2015-02-13 DIAGNOSIS — R1310 Dysphagia, unspecified: Secondary | ICD-10-CM | POA: Diagnosis present

## 2015-02-13 DIAGNOSIS — E119 Type 2 diabetes mellitus without complications: Secondary | ICD-10-CM | POA: Diagnosis not present

## 2015-02-13 DIAGNOSIS — G709 Myoneural disorder, unspecified: Secondary | ICD-10-CM | POA: Insufficient documentation

## 2015-02-13 DIAGNOSIS — K648 Other hemorrhoids: Secondary | ICD-10-CM | POA: Insufficient documentation

## 2015-02-13 DIAGNOSIS — Z7902 Long term (current) use of antithrombotics/antiplatelets: Secondary | ICD-10-CM | POA: Insufficient documentation

## 2015-02-13 DIAGNOSIS — I10 Essential (primary) hypertension: Secondary | ICD-10-CM | POA: Insufficient documentation

## 2015-02-13 DIAGNOSIS — I509 Heart failure, unspecified: Secondary | ICD-10-CM | POA: Diagnosis not present

## 2015-02-13 DIAGNOSIS — K449 Diaphragmatic hernia without obstruction or gangrene: Secondary | ICD-10-CM | POA: Diagnosis not present

## 2015-02-13 DIAGNOSIS — Z1211 Encounter for screening for malignant neoplasm of colon: Secondary | ICD-10-CM | POA: Insufficient documentation

## 2015-02-13 DIAGNOSIS — Z79899 Other long term (current) drug therapy: Secondary | ICD-10-CM | POA: Insufficient documentation

## 2015-02-13 DIAGNOSIS — Z91041 Radiographic dye allergy status: Secondary | ICD-10-CM | POA: Diagnosis not present

## 2015-02-13 DIAGNOSIS — K209 Esophagitis, unspecified: Secondary | ICD-10-CM | POA: Insufficient documentation

## 2015-02-13 DIAGNOSIS — I252 Old myocardial infarction: Secondary | ICD-10-CM | POA: Diagnosis not present

## 2015-02-13 DIAGNOSIS — Z7982 Long term (current) use of aspirin: Secondary | ICD-10-CM | POA: Diagnosis not present

## 2015-02-13 DIAGNOSIS — I2510 Atherosclerotic heart disease of native coronary artery without angina pectoris: Secondary | ICD-10-CM | POA: Insufficient documentation

## 2015-02-13 DIAGNOSIS — K2950 Unspecified chronic gastritis without bleeding: Secondary | ICD-10-CM | POA: Diagnosis not present

## 2015-02-13 HISTORY — DX: Type 2 diabetes mellitus without complications: E11.9

## 2015-02-13 HISTORY — DX: Heart failure, unspecified: I50.9

## 2015-02-13 HISTORY — DX: Acute myocardial infarction, unspecified: I21.9

## 2015-02-13 HISTORY — PX: ESOPHAGOGASTRODUODENOSCOPY (EGD) WITH PROPOFOL: SHX5813

## 2015-02-13 HISTORY — DX: Atherosclerotic heart disease of native coronary artery without angina pectoris: I25.10

## 2015-02-13 HISTORY — PX: COLONOSCOPY: SHX5424

## 2015-02-13 HISTORY — DX: Angina pectoris, unspecified: I20.9

## 2015-02-13 HISTORY — DX: Essential (primary) hypertension: I10

## 2015-02-13 HISTORY — DX: Myoneural disorder, unspecified: G70.9

## 2015-02-13 LAB — GLUCOSE, CAPILLARY: Glucose-Capillary: 120 mg/dL — ABNORMAL HIGH (ref 70–99)

## 2015-02-13 SURGERY — COLONOSCOPY
Anesthesia: General

## 2015-02-13 MED ORDER — GLYCOPYRROLATE 0.2 MG/ML IJ SOLN
INTRAMUSCULAR | Status: DC | PRN
  Administered 2015-02-13: 0.2 mg via INTRAVENOUS

## 2015-02-13 MED ORDER — SODIUM CHLORIDE 0.9 % IV SOLN
INTRAVENOUS | Status: DC
  Administered 2015-02-13: 1000 mL via INTRAVENOUS

## 2015-02-13 MED ORDER — EPHEDRINE SULFATE 50 MG/ML IJ SOLN
INTRAMUSCULAR | Status: DC | PRN
  Administered 2015-02-13 (×2): 5 mg via INTRAVENOUS

## 2015-02-13 MED ORDER — PROPOFOL INFUSION 10 MG/ML OPTIME
INTRAVENOUS | Status: DC | PRN
  Administered 2015-02-13: 100 ug/kg/min via INTRAVENOUS

## 2015-02-13 MED ORDER — PHENYLEPHRINE HCL 10 MG/ML IJ SOLN
INTRAMUSCULAR | Status: DC | PRN
  Administered 2015-02-13 (×2): 100 ug via INTRAVENOUS

## 2015-02-13 NOTE — Anesthesia Postprocedure Evaluation (Signed)
  Anesthesia Post-op Note  Patient: Ernest Carpenter  Procedure(s) Performed: Procedure(s): COLONOSCOPY (N/A) ESOPHAGOGASTRODUODENOSCOPY (EGD) WITH PROPOFOL  Anesthesia type:General  Patient location: PACU  Post pain: Pain level controlled  Post assessment: Post-op Vital signs reviewed, Patient's Cardiovascular Status Stable, Respiratory Function Stable, Patent Airway and No signs of Nausea or vomiting  Post vital signs: Reviewed and stable  Last Vitals:  Filed Vitals:   02/13/15 1055  BP:   Pulse:   Temp: 36.5 C  Resp:     Level of consciousness: awake, alert  and patient cooperative  Complications: No apparent anesthesia complications

## 2015-02-13 NOTE — Op Note (Signed)
[facility] Patient Name: Ernest GriffesRichard Carpenter Procedure Date: 02/13/2015 9:53 AM MRN: [id number] Account #: [id number] Date of Birth: [birth year] Admit Type: Outpatient Age: [age] [facility] Gender: Male Note Status: Finalized Procedure:         Upper GI endoscopy Indications:       Dysphagia Providers:         Christena DeemMartin U. Kiondra Caicedo, MD Referring MD:      Haynes Kernsharanjit S. Virk (Referring MD) Medicines:         Monitored Anesthesia Care Complications:     No immediate complications. Procedure:         Pre-Anesthesia Assessment:                    - ASA Grade Assessment: III - A patient with severe                     systemic disease.                    After obtaining informed consent, the endoscope was passed                     under direct vision. Throughout the procedure, the                     patient's blood pressure, pulse, and oxygen saturations                     were monitored continuously. The Endoscope was introduced                     through the mouth, and advanced to the third part of                     duodenum. The upper GI endoscopy was accomplished without                     difficulty. The patient tolerated the procedure well. Findings:      fibrotic ring noted at the cricopharyngeus. This was passed with the       scope with some dilation effect.      LA Grade A (one or more mucosal breaks less than 5 mm, not extending       between tops of 2 mucosal folds) esophagitis with no bleeding was found.       Biopsies were taken with a cold forceps for histology (taken after       dilation).      A low-grade of narrowing Schatzki ring (acquired) was found in the       cardia. A TTS dilator was passed through the scope. Dilation with a       07-17-11 mm x 5.5 cm CRE balloon (to a maximum balloon size of 12 mm)       dilator was performed with noted splitting of the ring.      A small hiatus hernia was found. The Z-line was a  variable distance from       incisors; the hiatal hernia was sliding.      Diffuse mildly erythematous mucosa without bleeding was found in the       gastric body. Biopsies were taken with a cold forceps for histology.       Biopsies were taken with a cold forceps for Helicobacter pylori testing.  The cardia and gastric fundus were normal on retroflexionotherwise.      The examined duodenum was normal.      Abnormal motility was noted in the lower third of the esophagus. The       cricopharyngeus was abnormal. There is spasticity of the esophageal       body. Secondary peristaltic waves are noted. Impression:        - LA Grade A erosive esophagitis. Biopsied.                    - Low-grade of narrowing Schatzki ring. Dilated.                    - Small hiatus hernia.                    - Erythematous mucosa in the gastric body. Biopsied.                    - Normal examined duodenum. Recommendation:    - Return to GI clinic in 3 weeks.                    - Refer to an ENT specialist if symptoms persist in                     regards to cricopharyngeal ring.                    - May need repeat dilation of the Schatzki ring, depending                     on the symptoms. Procedure Code(s): --- Professional ---                    [phone], Esophagogastroduodenoscopy, flexible, transoral;                     with transendoscopic balloon dilation of esophagus (less                     than 30 mm diameter)                    43239, Esophagogastroduodenoscopy, flexible, transoral;                     with biopsy, single or multiple Diagnosis Code(s): --- Professional ---                    530.19, Other esophagitis                    530.3, Stricture and stenosis of esophagus                    537.89, Other specified disorders of stomach and duodenum                    787.20, Dysphagia, unspecified                    553.3, Diaphragmatic hernia without mention of obstruction                      or gangrene CPT copyright 2014 American Medical Association. All rights reserved. The codes documented in this report are preliminary and upon coder review may  be revised to meet current compliance  requirements. Christena Deem, MD 02/13/2015 10:33:53 AM This report has been signed electronically. Number of Addenda: 0 Note Initiated On: 02/13/2015 9:53 AM      [facility]

## 2015-02-13 NOTE — Transfer of Care (Signed)
Immediate Anesthesia Transfer of Care Note  Patient: Ernest Carpenter  Procedure(s) Performed: Procedure(s): COLONOSCOPY (N/A) ESOPHAGOGASTRODUODENOSCOPY (EGD) WITH PROPOFOL  Patient Location: PACU  Anesthesia Type:General  Level of Consciousness: awake, alert  and oriented  Airway & Oxygen Therapy: Patient Spontanous Breathing and Patient connected to nasal cannula oxygen  Post-op Assessment: Report given to RN, Post -op Vital signs reviewed and stable and Patient moving all extremities X 4  Post vital signs: Reviewed and stable  Last Vitals:  Filed Vitals:   02/13/15 1055  BP:   Pulse:   Temp: 36.5 C  Resp:     Complications: No apparent anesthesia complications

## 2015-02-13 NOTE — Op Note (Signed)
[facility] Patient Name: Ernest GriffesRichard Keddy Procedure Date: 02/13/2015 9:50 AM MRN: [id number] Account #: [id number] Date of Birth: [birth year] Admit Type: Outpatient Age: [age] [facility] Gender: Male Note Status: Finalized Procedure:         Colonoscopy Indications:       Screening for colorectal malignant neoplasm Providers:         Christena DeemMartin U. Ambrielle Kington, MD Referring MD:      Haynes Kernsharanjit S. Virk (Referring MD) Medicines:         Monitored Anesthesia Care Complications:     No immediate complications. Procedure:         Pre-Anesthesia Assessment:                    - ASA Grade Assessment: III - A patient with severe                     systemic disease.                    After obtaining informed consent, the colonoscope was                     passed under direct vision. Throughout the procedure, the                     patient's blood pressure, pulse, and oxygen saturations                     were monitored continuously. The Olympus PCF-H180AL                     colonoscope ( S#: [id number] ) was introduced through the                     anus and advanced to the the cecum, identified by                     appendiceal orifice and ileocecal valve. The colonoscopy                     was performed without difficulty. The patient tolerated                     the procedure well. The quality of the bowel preparation                     was good. Findings:      The colon (entire examined portion) appeared normal.      Non-bleeding internal hemorrhoids were found during retroflexion. The       hemorrhoids were small.      No additional abnormalities were found on retroflexion. Impression:        - The entire examined colon is normal.                    - Non-bleeding internal hemorrhoids.                    - No specimens collected. Recommendation:    - Repeat colonoscopy in 10 years for screening purposes. Procedure Code(s): --- Professional  ---                    [phone], Colonoscopy, flexible; diagnostic, including  collection of specimen(s) by brushing or washing, when                     performed (separate procedure) CPT copyright 2014 American Medical Association. All rights reserved. The codes documented in this report are preliminary and upon coder review may  be revised to meet current compliance requirements. Christena DeemMartin U Arleigh Dicola, MD 02/13/2015 10:52:10 AM This report has been signed electronically. Number of Addenda: 0 Note Initiated On: 02/13/2015 9:50 AM Scope Withdrawal Time: 0 hours 6 minutes 31 seconds  Total Procedure Duration: 0 hours 11 minutes 58 seconds       [facility]

## 2015-02-13 NOTE — H&P (Signed)
Outpatient short stay form Pre-procedure 02/13/2015 4:13 PM Ernest Carpenter  Primary Physician: Dr Elmer RampVirk  Reason for visit:  Outpatient EGD and colonoscopy in regards to dysphagia and colon screening.  History of present illness:  Patient was sent for a screening colonoscopy. In further discussion with the patient on his visit 01/24/2015 was noted he was having some difficulties with swallowing meats and large pills. She been something that he has been having for about 3 or 4 years. It were no acute changes. A barium swallow had been obtained as an outpatient showing a narrowing just below the echo fringes or at the cricopharyngeus. He was also the esophagus as well as a Schatzki ring. He is presenting today for further evaluation.   No current facility-administered medications for this encounter.  Current outpatient prescriptions:  .  acetaminophen (TYLENOL) 500 MG tablet, Take 1,000 mg by mouth every 8 (eight) hours as needed for mild pain or moderate pain., Disp: , Rfl:  .  aspirin 81 MG tablet, Take 81 mg by mouth daily., Disp: , Rfl:  .  atenolol (TENORMIN) 25 MG tablet, 25 mg., Disp: , Rfl:  .  atorvastatin (LIPITOR) 10 MG tablet, Take 10 mg by mouth daily., Disp: , Rfl:  .  clopidogrel (PLAVIX) 75 MG tablet, Take 75 mg by mouth daily., Disp: , Rfl:  .  gabapentin (NEURONTIN) 600 MG tablet, Take 600 mg by mouth 2 (two) times daily., Disp: , Rfl:  .  glipiZIDE (GLUCOTROL) 10 MG tablet, Take 10 mg by mouth 2 (two) times daily before a meal., Disp: , Rfl:  .  ibuprofen (ADVIL,MOTRIN) 200 MG tablet, Take 400 mg by mouth every 6 (six) hours as needed for mild pain or moderate pain., Disp: , Rfl:  .  lisinopril-hydrochlorothiazide (PRINZIDE,ZESTORETIC) 20-25 MG per tablet, Take 1 tablet by mouth daily., Disp: , Rfl:  .  metFORMIN (GLUCOPHAGE) 1000 MG tablet, Take 1,000 mg by mouth 2 (two) times daily with a meal., Disp: , Rfl:  .  omeprazole (PRILOSEC) 20 MG capsule, Take 20 mg by mouth  daily., Disp: , Rfl:    Allergies  Allergen Reactions  . Ivp Dye [Iodinated Diagnostic Agents] Rash     Past Medical History  Diagnosis Date  . Coronary artery disease   . Hypertension   . Neuromuscular disorder   . Diabetes mellitus without complication   . CHF (congestive heart failure)   . Myocardial infarction   . Anginal pain     Review of systems:      Physical Exam    Heart and lungs: Regular rate and rhythm without rub or gallop. Clear to auscultation    HEENT: Normocephalic atraumatic eyes are anicteric    Other:     Pertinant exam for procedure: Soft nontender nondistended bowel sounds positive normoactive    Planned proceedures: EGD and colonoscopy with indicated procedures. I have discussed the risks benefits and complications of procedures to include not limited to bleeding, infection, perforation and the risk of sedation and the patient wishes to proceed.    Ernest DeemMartin U Ernest Osterberg, [facility] 02/13/2015  4:13 PM

## 2015-02-13 NOTE — Anesthesia Preprocedure Evaluation (Signed)
Anesthesia Evaluation  Patient identified by MRN, date of birth, ID band Patient awake    Reviewed: Allergy & Precautions, H&P , NPO status , Patient's Chart, lab work & pertinent test results, reviewed documented beta blocker date and time   Airway Mallampati: III  TM Distance: >3 FB Neck ROM: full    Dental no notable dental hx.    Pulmonary neg pulmonary ROS, former smoker,  breath sounds clear to auscultation  Pulmonary exam normal       Cardiovascular Exercise Tolerance: Good hypertension, + angina with exertion + CAD, + Past MI, + Cardiac Stents and +CHF negative cardio ROS  Rhythm:regular Rate:Normal     Neuro/Psych  Neuromuscular disease negative neurological ROS  negative psych ROS   GI/Hepatic negative GI ROS, Neg liver ROS,   Endo/Other  negative endocrine ROSdiabetes  Renal/GU negative Renal ROS  negative genitourinary   Musculoskeletal   Abdominal   Peds  Hematology negative hematology ROS (+)   Anesthesia Other Findings   Reproductive/Obstetrics negative OB ROS                             Anesthesia Physical Anesthesia Plan  ASA: III  Anesthesia Plan: General   Post-op Pain Management:    Induction:   Airway Management Planned:   Additional Equipment:   Intra-op Plan:   Post-operative Plan:   Informed Consent: I have reviewed the patients History and Physical, chart, labs and discussed the procedure including the risks, benefits and alternatives for the proposed anesthesia with the patient or authorized representative who has indicated his/her understanding and acceptance.   Dental Advisory Given  Plan Discussed with: CRNA  Anesthesia Plan Comments:         Anesthesia Quick Evaluation

## 2015-02-13 NOTE — Anesthesia Postprocedure Evaluation (Signed)
  Anesthesia Post-op Note  Patient: Ernest Carpenter  Procedure(s) Performed: Procedure(s): COLONOSCOPY (N/A) ESOPHAGOGASTRODUODENOSCOPY (EGD) WITH PROPOFOL  Anesthesia type:General  Patient location: PACU  Post pain: Pain level controlled  Post assessment: Post-op Vital signs reviewed, Patient's Cardiovascular Status Stable, Respiratory Function Stable, Patent Airway and No signs of Nausea or vomiting  Post vital signs: Reviewed and stable  Last Vitals:  Filed Vitals:   02/13/15 1130  BP: 114/75  Pulse: 71  Temp:   Resp: 14    Level of consciousness: awake, alert  and patient cooperative  Complications: No apparent anesthesia complications

## 2015-02-14 ENCOUNTER — Encounter: Payer: Self-pay | Admitting: Gastroenterology

## 2015-02-14 LAB — SURGICAL PATHOLOGY

## 2017-06-07 ENCOUNTER — Emergency Department: Payer: Medicare Other

## 2017-06-07 ENCOUNTER — Other Ambulatory Visit: Payer: Medicare Other

## 2017-06-07 ENCOUNTER — Observation Stay
Admission: EM | Admit: 2017-06-07 | Discharge: 2017-06-07 | Disposition: A | Discharge status: Home / Self Care | Payer: Medicare Other | Attending: Internal Medicine | Admitting: Internal Medicine

## 2017-06-07 DIAGNOSIS — I2510 Atherosclerotic heart disease of native coronary artery without angina pectoris: Secondary | ICD-10-CM | POA: Insufficient documentation

## 2017-06-07 DIAGNOSIS — I252 Old myocardial infarction: Secondary | ICD-10-CM | POA: Insufficient documentation

## 2017-06-07 DIAGNOSIS — Z7984 Long term (current) use of oral hypoglycemic drugs: Secondary | ICD-10-CM | POA: Insufficient documentation

## 2017-06-07 DIAGNOSIS — E785 Hyperlipidemia, unspecified: Secondary | ICD-10-CM | POA: Insufficient documentation

## 2017-06-07 DIAGNOSIS — R079 Chest pain, unspecified: Secondary | ICD-10-CM

## 2017-06-07 DIAGNOSIS — Z7982 Long term (current) use of aspirin: Secondary | ICD-10-CM | POA: Diagnosis not present

## 2017-06-07 DIAGNOSIS — R0789 Other chest pain: Secondary | ICD-10-CM | POA: Diagnosis not present

## 2017-06-07 DIAGNOSIS — K8020 Calculus of gallbladder without cholecystitis without obstruction: Secondary | ICD-10-CM | POA: Insufficient documentation

## 2017-06-07 DIAGNOSIS — E119 Type 2 diabetes mellitus without complications: Secondary | ICD-10-CM | POA: Diagnosis not present

## 2017-06-07 DIAGNOSIS — Z91041 Radiographic dye allergy status: Secondary | ICD-10-CM | POA: Diagnosis not present

## 2017-06-07 DIAGNOSIS — Z7902 Long term (current) use of antithrombotics/antiplatelets: Secondary | ICD-10-CM | POA: Diagnosis not present

## 2017-06-07 DIAGNOSIS — I5032 Chronic diastolic (congestive) heart failure: Secondary | ICD-10-CM | POA: Insufficient documentation

## 2017-06-07 DIAGNOSIS — I110 Hypertensive heart disease with heart failure: Secondary | ICD-10-CM | POA: Diagnosis not present

## 2017-06-07 DIAGNOSIS — Z87891 Personal history of nicotine dependence: Secondary | ICD-10-CM | POA: Insufficient documentation

## 2017-06-07 DIAGNOSIS — K219 Gastro-esophageal reflux disease without esophagitis: Secondary | ICD-10-CM | POA: Insufficient documentation

## 2017-06-07 DIAGNOSIS — I10 Essential (primary) hypertension: Secondary | ICD-10-CM | POA: Diagnosis not present

## 2017-06-07 DIAGNOSIS — Z791 Long term (current) use of non-steroidal anti-inflammatories (NSAID): Secondary | ICD-10-CM | POA: Insufficient documentation

## 2017-06-07 LAB — CBC
HCT: 42.8 % (ref 40.0–52.0)
HEMATOCRIT: 41.5 % (ref 40.0–52.0)
HEMOGLOBIN: 13.8 g/dL (ref 13.0–18.0)
HEMOGLOBIN: 14.2 g/dL (ref 13.0–18.0)
MCH: 26.3 pg (ref 26.0–34.0)
MCH: 26.5 pg (ref 26.0–34.0)
MCHC: 33.2 g/dL (ref 32.0–36.0)
MCHC: 33.3 g/dL (ref 32.0–36.0)
MCV: 78.9 fL — ABNORMAL LOW (ref 80.0–100.0)
MCV: 79.8 fL — ABNORMAL LOW (ref 80.0–100.0)
Platelets: 292 10*3/uL (ref 150–440)
Platelets: 311 10*3/uL (ref 150–440)
RBC: 5.25 MIL/uL (ref 4.40–5.90)
RBC: 5.36 MIL/uL (ref 4.40–5.90)
RDW: 16.5 % — ABNORMAL HIGH (ref 11.5–14.5)
RDW: 16.7 % — ABNORMAL HIGH (ref 11.5–14.5)
WBC: 13.1 10*3/uL — ABNORMAL HIGH (ref 3.8–10.6)
WBC: 14.8 10*3/uL — AB (ref 3.8–10.6)

## 2017-06-07 LAB — LIPID PANEL
CHOLESTEROL: 129 mg/dL (ref 0–200)
HDL: 39 mg/dL — ABNORMAL LOW (ref >40)
LDL CALC: 55 mg/dL (ref 0–99)
Total CHOL/HDL Ratio: 3.3 RATIO
Triglycerides: 176 mg/dL — ABNORMAL HIGH (ref <150)
VLDL: 35 mg/dL (ref 0–40)

## 2017-06-07 LAB — HEPATIC FUNCTION PANEL
ALT: 20 U/L (ref 17–63)
AST: 28 U/L (ref 15–41)
Albumin: 4.2 g/dL (ref 3.5–5.0)
Alkaline Phosphatase: 85 U/L (ref 38–126)
TOTAL PROTEIN: 7.8 g/dL (ref 6.5–8.1)
Total Bilirubin: 0.9 mg/dL (ref 0.3–1.2)

## 2017-06-07 LAB — BASIC METABOLIC PANEL
ANION GAP: 11 (ref 5–15)
Anion gap: 8 (ref 5–15)
BUN: 16 mg/dL (ref 6–20)
BUN: 17 mg/dL (ref 6–20)
CALCIUM: 8.9 mg/dL (ref 8.9–10.3)
CALCIUM: 9.4 mg/dL (ref 8.9–10.3)
CO2: 29 mmol/L (ref 22–32)
CO2: 30 mmol/L (ref 22–32)
CREATININE: 1.24 mg/dL (ref 0.61–1.24)
Chloride: 96 mmol/L — ABNORMAL LOW (ref 101–111)
Chloride: 97 mmol/L — ABNORMAL LOW (ref 101–111)
Creatinine, Ser: 1.44 mg/dL — ABNORMAL HIGH (ref 0.61–1.24)
GFR calc Af Amer: >60 mL/min (ref >60)
GFR calc non Af Amer: 48 mL/min — ABNORMAL LOW (ref >60)
GFR calc non Af Amer: 57 mL/min — ABNORMAL LOW (ref >60)
GFR, EST AFRICAN AMERICAN: 55 mL/min — AB (ref >60)
Glucose, Bld: 145 mg/dL — ABNORMAL HIGH (ref 65–99)
Glucose, Bld: 164 mg/dL — ABNORMAL HIGH (ref 65–99)
POTASSIUM: 4.3 mmol/L (ref 3.5–5.1)
Potassium: 3.8 mmol/L (ref 3.5–5.1)
SODIUM: 135 mmol/L (ref 135–145)
Sodium: 136 mmol/L (ref 135–145)

## 2017-06-07 LAB — GLUCOSE, CAPILLARY: GLUCOSE-CAPILLARY: 120 mg/dL — AB (ref 65–99)

## 2017-06-07 LAB — TROPONIN I
Troponin I: <0.03 ng/mL (ref <0.03)
Troponin I: 0.03 ng/mL (ref <0.03)

## 2017-06-07 LAB — LIPASE, BLOOD: Lipase: 37 U/L (ref 11–51)

## 2017-06-07 LAB — ETHANOL: Alcohol, Ethyl (B): <5 mg/dL (ref <5)

## 2017-06-07 MED ORDER — SODIUM CHLORIDE 0.9% FLUSH: 3.0000 mL | INTRAVENOUS | Status: DC | PRN

## 2017-06-07 MED ORDER — PANTOPRAZOLE SODIUM 40 MG PO TBEC
40.0000 mg | DELAYED_RELEASE_TABLET | Freq: Every day | ORAL | Status: DC
  Administered 2017-06-07: 40 mg via ORAL
  Filled 2017-06-07: qty 1

## 2017-06-07 MED ORDER — SODIUM CHLORIDE 0.9% FLUSH
3.0000 mL | Freq: Two times a day (BID) | INTRAVENOUS | Status: DC
  Administered 2017-06-07: 3 mL via INTRAVENOUS

## 2017-06-07 MED ORDER — ASPIRIN 81 MG PO CHEW
324.0000 mg | CHEWABLE_TABLET | Freq: Once | ORAL | Status: AC
  Administered 2017-06-07: 324 mg via ORAL

## 2017-06-07 MED ORDER — MORPHINE SULFATE (PF) 4 MG/ML IV SOLN
INTRAVENOUS | Status: AC
  Filled 2017-06-07: qty 1

## 2017-06-07 MED ORDER — ONDANSETRON HCL 4 MG/2ML IJ SOLN
INTRAMUSCULAR | Status: AC
  Filled 2017-06-07: qty 2

## 2017-06-07 MED ORDER — LISINOPRIL-HYDROCHLOROTHIAZIDE 20-25 MG PO TABS: 1.0000 | ORAL_TABLET | Freq: Every day | ORAL | Status: DC

## 2017-06-07 MED ORDER — INSULIN ASPART 100 UNIT/ML ~~LOC~~ SOLN
0.0000 [IU] | Freq: Every day | SUBCUTANEOUS | Status: DC
Start: 2017-06-07 — End: 2017-06-07

## 2017-06-07 MED ORDER — ASPIRIN 300 MG RE SUPP: 300.0000 mg | RECTAL | Status: DC

## 2017-06-07 MED ORDER — GABAPENTIN 600 MG PO TABS
600.0000 mg | ORAL_TABLET | Freq: Two times a day (BID) | ORAL | Status: DC
  Administered 2017-06-07: 600 mg via ORAL
  Filled 2017-06-07: qty 1

## 2017-06-07 MED ORDER — MORPHINE SULFATE (PF) 2 MG/ML IV SOLN: 2.0000 mg | INTRAVENOUS | Status: DC | PRN

## 2017-06-07 MED ORDER — LISINOPRIL 20 MG PO TABS
20.0000 mg | ORAL_TABLET | Freq: Every day | ORAL | Status: DC
  Administered 2017-06-07: 20 mg via ORAL
  Filled 2017-06-07: qty 1

## 2017-06-07 MED ORDER — ASPIRIN 81 MG PO CHEW
81.0000 mg | CHEWABLE_TABLET | Freq: Every day | ORAL | Status: DC
  Administered 2017-06-07: 81 mg via ORAL
  Filled 2017-06-07: qty 1

## 2017-06-07 MED ORDER — ASPIRIN 81 MG PO CHEW
CHEWABLE_TABLET | ORAL | Status: AC
  Filled 2017-06-07: qty 4

## 2017-06-07 MED ORDER — PANTOPRAZOLE SODIUM 40 MG PO TBEC: 40.0000 mg | DELAYED_RELEASE_TABLET | Freq: Two times a day (BID) | ORAL | 0 refills | Status: AC

## 2017-06-07 MED ORDER — ONDANSETRON HCL 4 MG/2ML IJ SOLN: 4.0000 mg | Freq: Four times a day (QID) | INTRAMUSCULAR | Status: DC | PRN

## 2017-06-07 MED ORDER — NITROGLYCERIN 0.4 MG SL SUBL: 0.4000 mg | SUBLINGUAL_TABLET | SUBLINGUAL | Status: DC | PRN

## 2017-06-07 MED ORDER — ATORVASTATIN CALCIUM 10 MG PO TABS
10.0000 mg | ORAL_TABLET | Freq: Every day | ORAL | Status: DC
  Administered 2017-06-07: 10 mg via ORAL
  Filled 2017-06-07: qty 1

## 2017-06-07 MED ORDER — ONDANSETRON HCL 4 MG/2ML IJ SOLN
4.0000 mg | Freq: Once | INTRAMUSCULAR | Status: AC
  Administered 2017-06-07: 4 mg via INTRAVENOUS

## 2017-06-07 MED ORDER — ENOXAPARIN SODIUM 40 MG/0.4ML ~~LOC~~ SOLN
40.0000 mg | SUBCUTANEOUS | Status: DC
  Administered 2017-06-07: 40 mg via SUBCUTANEOUS
  Filled 2017-06-07: qty 0.4

## 2017-06-07 MED ORDER — MORPHINE SULFATE (PF) 4 MG/ML IV SOLN
4.0000 mg | Freq: Once | INTRAVENOUS | Status: AC
  Administered 2017-06-07: 4 mg via INTRAVENOUS

## 2017-06-07 MED ORDER — OXYCODONE HCL 5 MG PO TABS: 5.0000 mg | ORAL_TABLET | Freq: Three times a day (TID) | ORAL | 0 refills | Status: AC | PRN

## 2017-06-07 MED ORDER — ATENOLOL 25 MG PO TABS
25.0000 mg | ORAL_TABLET | Freq: Every day | ORAL | Status: DC
  Administered 2017-06-07: 25 mg via ORAL
  Filled 2017-06-07: qty 1

## 2017-06-07 MED ORDER — CLOPIDOGREL BISULFATE 75 MG PO TABS
75.0000 mg | ORAL_TABLET | Freq: Every day | ORAL | Status: DC
  Administered 2017-06-07: 75 mg via ORAL
  Filled 2017-06-07: qty 1

## 2017-06-07 MED ORDER — INSULIN ASPART 100 UNIT/ML ~~LOC~~ SOLN: 0.0000 [IU] | Freq: Three times a day (TID) | SUBCUTANEOUS | Status: DC

## 2017-06-07 MED ORDER — SODIUM CHLORIDE 0.9 % IV SOLN: 250.0000 mL | INTRAVENOUS | Status: DC | PRN

## 2017-06-07 MED ORDER — ASPIRIN EC 81 MG PO TBEC: 81.0000 mg | DELAYED_RELEASE_TABLET | Freq: Every day | ORAL | Status: DC

## 2017-06-07 MED ORDER — ASPIRIN 81 MG PO CHEW
324.0000 mg | CHEWABLE_TABLET | ORAL | Status: DC
  Filled 2017-06-07: qty 4

## 2017-06-07 MED ORDER — SODIUM CHLORIDE 0.9 % IV BOLUS (SEPSIS)
500.0000 mL | Freq: Once | INTRAVENOUS | Status: AC
  Administered 2017-06-07: 500 mL via INTRAVENOUS

## 2017-06-07 MED ORDER — HYDROCHLOROTHIAZIDE 25 MG PO TABS
25.0000 mg | ORAL_TABLET | Freq: Every day | ORAL | Status: DC
  Administered 2017-06-07: 25 mg via ORAL
  Filled 2017-06-07: qty 1

## 2017-06-07 MED ORDER — MORPHINE SULFATE (PF) 4 MG/ML IV SOLN
INTRAVENOUS | Status: DC
Start: 2017-06-07 — End: 2017-06-07
  Filled 2017-06-07: qty 1

## 2017-06-07 MED ORDER — ACETAMINOPHEN 325 MG PO TABS: 650.0000 mg | ORAL_TABLET | ORAL | Status: DC | PRN

## 2017-06-07 NOTE — ED Triage Notes (Signed)
Patient presents with central chest pain, nonradiating, without associated symptoms including N/V or diaphoresis. Patient states he has an extensive cardiac history but this pain is different. States there is some pressure but mostly aching. Denies nausea or vomiting.

## 2017-06-07 NOTE — ED Notes (Signed)
Pt taken to xray 

## 2017-06-07 NOTE — Progress Notes (Signed)
Pt to be discharged today. Iv and tele removed. disch instructions and prescrips given to pt. disch via w.c. Accompanied by wife. 

## 2017-06-07 NOTE — ED Notes (Signed)
Pt taken to US

## 2017-06-07 NOTE — H&P (Signed)
[facility]icians -  at [facility]   PATIENT NAME: Ernest Carpenter    MR#:  [id number]  DATE OF BIRTH:  1945/12/26  DATE OF ADMISSION:  06/07/2017  PRIMARY CARE PHYSICIAN: Cheryll Dessert, FNP   REQUESTING/REFERRING PHYSICIAN:   CHIEF COMPLAINT:   Chief Complaint  Patient presents with  . Chest Pain    HISTORY OF PRESENT ILLNESS: Ernest Carpenter  is a 71 y.o. male with a known history of congestive heart failure, coronary artery disease, diabetes mellitus, hypertension presented to the emergency room with chest pain.chest pain is located in the left side of the chest and sharp in nature 8 out of 10 on a scale of 1-10. Patient also complains of sharp epigastric pain. No nausea and vomiting. No complaints of shortness of breath. He was evaluated in the emergency room first set of troponin was negative, ultrasound of the abdomen showed cholelithiasis. No evidence of any inflammation in the gallbladder. Hospitalist service was consulted. Patient usually follows up with [facility] as outpatient but has seen [facility] clinic cardiology in the past.  PAST MEDICAL HISTORY:   Past Medical History:  Diagnosis Date  . Anginal pain (HCC)   . CHF (congestive heart failure) (HCC)   . Coronary artery disease   . Diabetes mellitus without complication (HCC)   . Hypertension   . Myocardial infarction (HCC)   . Neuromuscular disorder (HCC)     PAST SURGICAL HISTORY: Past Surgical History:  Procedure Laterality Date  . COLONOSCOPY N/A 02/13/2015   Procedure: COLONOSCOPY;  Surgeon: Christena Deem, MD;  Location: [facility] ENDOSCOPY;  Service: Endoscopy;  Laterality: N/A;  . CORONARY ANGIOPLASTY    . ESOPHAGOGASTRODUODENOSCOPY (EGD) WITH PROPOFOL  02/13/2015   Procedure: ESOPHAGOGASTRODUODENOSCOPY (EGD) WITH PROPOFOL;  Surgeon: Christena Deem, MD;  Location: [facility] ENDOSCOPY;  Service: Endoscopy;;  . TOE AMPUTATION Right   . TONSILLECTOMY      SOCIAL HISTORY:  Social  History  Substance Use Topics  . Smoking status: Former Smoker    Quit date: 10/15/2000  . Smokeless tobacco: Not on file  . Alcohol use No    FAMILY HISTORY: No family history on file.  DRUG ALLERGIES:  Allergies  Allergen Reactions  . Ivp Dye [Iodinated Diagnostic Agents] Rash    REVIEW OF SYSTEMS:   CONSTITUTIONAL: No fever, fatigue or weakness.  EYES: No blurred or double vision.  EARS, NOSE, AND THROAT: No tinnitus or ear pain.  RESPIRATORY: No cough, shortness of breath, wheezing or hemoptysis.  CARDIOVASCULAR: Has chest pain, no orthopnea, edema.  GASTROINTESTINAL: No nausea, vomiting, diarrhea or abdominal pain.  GENITOURINARY: No dysuria, hematuria.  ENDOCRINE: No polyuria, nocturia,  HEMATOLOGY: No anemia, easy bruising or bleeding SKIN: No rash or lesion. MUSCULOSKELETAL: No joint pain or arthritis.   NEUROLOGIC: No tingling, numbness, weakness.  PSYCHIATRY: No anxiety or depression.   MEDICATIONS AT HOME:  Prior to Admission medications   Medication Sig Start Date End Date Taking? Authorizing Provider  acetaminophen (TYLENOL) 500 MG tablet Take 1,000 mg by mouth every 8 (eight) hours as needed for mild pain or moderate pain.    [provider]  aspirin 81 MG tablet Take 81 mg by mouth daily.    [provider]  atenolol (TENORMIN) 25 MG tablet 25 mg.    [provider]  atorvastatin (LIPITOR) 10 MG tablet Take 10 mg by mouth daily.    [provider]  clopidogrel (PLAVIX) 75 MG tablet Take 75 mg by mouth daily.  [provider]  gabapentin (NEURONTIN) 600 MG tablet Take 600 mg by mouth 2 (two) times daily.    [provider]  glipiZIDE (GLUCOTROL) 10 MG tablet Take 10 mg by mouth 2 (two) times daily before a meal.    [provider]  ibuprofen (ADVIL,MOTRIN) 200 MG tablet Take 400 mg by mouth every 6 (six) hours as needed for mild pain or moderate pain.    [provider]   lisinopril-hydrochlorothiazide (PRINZIDE,ZESTORETIC) 20-25 MG per tablet Take 1 tablet by mouth daily.    [provider]  metFORMIN (GLUCOPHAGE) 1000 MG tablet Take 1,000 mg by mouth 2 (two) times daily with a meal.    [provider]  omeprazole (PRILOSEC) 20 MG capsule Take 20 mg by mouth daily.    [provider]      PHYSICAL EXAMINATION:   VITAL SIGNS: Blood pressure (!) 174/89, pulse 63, temperature 97.7 F (36.5 C), temperature source Oral, resp. rate 15, height 5\' 11"  (1.803 m), weight 83.9 kg (185 lb), SpO2 92 %.  GENERAL:  71 y.o.-year-old patient lying in the bed with no acute distress.  EYES: Pupils equal, round, reactive to light and accommodation. No scleral icterus. Extraocular muscles intact.  HEENT: Head atraumatic, normocephalic. Oropharynx and nasopharynx clear.  NECK:  Supple, no jugular venous distention. No thyroid enlargement, no tenderness.  LUNGS: Normal breath sounds bilaterally, no wheezing, rales,rhonchi or crepitation. No use of accessory muscles of respiration.  CARDIOVASCULAR: S1, S2 normal. No murmurs, rubs, or gallops.  ABDOMEN: Soft, nontender, nondistended. Bowel sounds present. No organomegaly or mass.  EXTREMITIES: No pedal edema, cyanosis, or clubbing.  NEUROLOGIC: Cranial nerves II through XII are intact. Muscle strength 5/5 in all extremities. Sensation intact. Gait not checked.  PSYCHIATRIC: The patient is alert and oriented x 3.  SKIN: No obvious rash, lesion, or ulcer.   LABORATORY PANEL:   CBC  Recent Labs Lab 06/07/17 0018  WBC 13.1*  HGB 14.2  HCT 42.8  PLT 311  MCV 79.8*  MCH 26.5  MCHC 33.2  RDW 16.5*   ------------------------------------------------------------------------------------------------------------------  Chemistries   Recent Labs Lab 06/07/17 0018  NA 136  K 4.3  CL 96*  CO2 29  GLUCOSE 164*  BUN 16  CREATININE 1.44*  CALCIUM 9.4  AST 28  ALT 20  ALKPHOS 85  BILITOT 0.9    ------------------------------------------------------------------------------------------------------------------ estimated creatinine clearance is 50.8 mL/min (A) (by C-G formula based on SCr of 1.44 mg/dL (H)). ------------------------------------------------------------------------------------------------------------------ No results for input(s): TSH, T4TOTAL, T3FREE, THYROIDAB in the last 72 hours.  Invalid input(s): FREET3   Coagulation profile No results for input(s): INR, PROTIME in the last 168 hours. ------------------------------------------------------------------------------------------------------------------- No results for input(s): DDIMER in the last 72 hours. -------------------------------------------------------------------------------------------------------------------  Cardiac Enzymes  Recent Labs Lab 06/07/17 0018  TROPONINI <0.03   ------------------------------------------------------------------------------------------------------------------ Invalid input(s): POCBNP  ---------------------------------------------------------------------------------------------------------------  Urinalysis No results found for: COLORURINE, APPEARANCEUR, LABSPEC, PHURINE, GLUCOSEU, HGBUR, BILIRUBINUR, KETONESUR, PROTEINUR, UROBILINOGEN, NITRITE, LEUKOCYTESUR   RADIOLOGY: Dg Chest 2 View  Result Date: 06/07/2017 CLINICAL DATA:  Central non radiating chest pain. EXAM: CHEST  2 VIEW COMPARISON:  10/08/2013 FINDINGS: Hyperinflation and chronic interstitial coarsening. No airspace consolidation. No effusion. Normal hilar, mediastinal and cardiac contours. IMPRESSION: Hyperinflation and chronic appearing interstitial coarsening. No acute cardiopulmonary finding. Electronically Signed   By: Ellery Plunkaniel R Mitchell M.D.   On: 06/07/2017 01:30   [place] Abdomen Limited Ruq  Result Date: 06/07/2017 CLINICAL DATA:  71 y/o  M; 6 hours of epigastric pain. EXAM: ULTRASOUND  ABDOMEN LIMITED  RIGHT UPPER QUADRANT COMPARISON:  None. FINDINGS: Gallbladder: Sludge and small stones. The largest stone measures up to 5 mm. No gallbladder wall thickening or pericholecystic fluid. Negative sonographic Murphy's sign. Common bile duct: Diameter: 3.8 mm Liver: Increased liver echogenicity compatible with steatosis. Portal vein is patent on color Doppler imaging with normal direction of blood flow towards the liver. IMPRESSION: 1. Gallbladder sludge and small stones. No secondary signs of acute cholecystitis. 2. Hepatic steatosis. Electronically Signed   By: Mitzi Hansen M.D.   On: 06/07/2017 04:19    EKG: Orders placed or performed during the hospital encounter of 06/07/17  . EKG 12-Lead  . EKG 12-Lead  . ED EKG within 10 minutes  . ED EKG within 10 minutes    IMPRESSION AND PLAN: 71 year old male patient with history of congestive heart failure, diabetes mellitus2, hypertension presented to the emergency room with chest pain and epigastric pain. Admitting diagnosis 1. Unstable angina 2. Cholelithiasis 3. Diabetes mellitus type 2 4. Congestive heart failure 5. Hypertension  Treatment plan Admit patient to telemetry observation bed Cycle troponin Start patient on aspirin When necessary nitrates for chest pain Cardiology consultation Cardiac stress test to assess for ischemia Medical management for diabetes mellitus  All the records are reviewed and case discussed with ED provider. Management plans discussed with the patient, family and they are in agreement.  CODE STATUS:FULL CODE Surrogate decision maker : wife Code Status History    This patient does not have a recorded code status. Please follow your organizational policy for patients in this situation.       TOTAL TIME TAKING CARE OF THIS PATIENT: 50 minutes.    Ihor Austin M.D on 06/07/2017 at 5:11 AM  Between 7am to 6pm - Pager - [phone]  After 6pm go to [email or web address] - password [email or web address] [facility]  [place]  [place] [facility]  Office  [phone]  CC: Primary care physician; Cheryll Dessert, FNP

## 2017-06-07 NOTE — ED Notes (Signed)
Pt return from US  

## 2017-06-07 NOTE — ED Notes (Signed)
Attempted to call report, floor requests call back in 10 min

## 2017-06-07 NOTE — ED Provider Notes (Signed)
[facility] Emergency Department Provider Note   ________________________[facility]____________________   First MD Initiated Contact with Patient 06/07/17 0034     (approximate)  I have reviewed the triage vital signs and the nursing notes.   HISTORY  Chief Complaint Chest Pain    HPI Ernest Carpenter is a 71 y.o. male who presents to the ED from home with a chief complaint of chest pain. Patient has a history of CAD status post angioplasty, diabetes, hypertension, CHF who reports onset of central chest aching pain approximately 4 hours ago after eating a hamburger. Symptoms associated with diaphoresis. Denies associated symptoms of shortness of breath,nauseous as vomiting, dizziness or palpitations. Denies recent travel or trauma. Did not take anything prior to arrival.   Past Medical History:  Diagnosis Date  . Anginal pain (HCC)   . CHF (congestive heart failure) (HCC)   . Coronary artery disease   . Diabetes mellitus without complication (HCC)   . Hypertension   . Myocardial infarction (HCC)   . Neuromuscular disorder (HCC)     There are no active problems to display for this patient.   Past Surgical History:  Procedure Laterality Date  . COLONOSCOPY N/A 02/13/2015   Procedure: COLONOSCOPY;  Surgeon: Christena DeemMartin U Skulskie, MD;  [facility];  Service: Endoscopy;  Laterality: N/A;  . CORONARY ANGIOPLASTY    . ESOPHAGOGASTRODUODENOSCOPY (EGD) WITH PROPOFOL  02/13/2015   Procedure: ESOPHAGOGASTRODUODENOSCOPY (EGD) WITH PROPOFOL;  Surgeon: Christena DeemMartin U Skulskie, MD;  [facility] END[facility]COPY;  Service: Endoscopy;;  . TOE AMPUTATION Right   . TONSILLECTOMY      Prior to Admission medications   Medication Sig Start Date End Date Taking? Authorizing Provider  acetaminophen (TYLENOL) 500 MG tablet Take 1,000 mg by mouth every 8 (eight) hours as needed for mild pain or moderate pain.    [provider]  aspirin 81 MG tablet Take 81 mg by mouth  daily.    [provider]  atenolol (TENORMIN) 25 MG tablet 25 mg.    [provider]  atorvastatin (LIPITOR) 10 MG tablet Take 10 mg by mouth daily.    [provider]  clopidogrel (PLAVIX) 75 MG tablet Take 75 mg by mouth daily.    [provider]  gabapentin (NEURONTIN) 600 MG tablet Take 600 mg by mouth 2 (two) times daily.    [provider]  glipiZIDE (GLUCOTROL) 10 MG tablet Take 10 mg by mouth 2 (two) times daily before a meal.    [provider]  ibuprofen (ADVIL,MOTRIN) 200 MG tablet Take 400 mg by mouth every 6 (six) hours as needed for mild pain or moderate pain.    [provider]  lisinopril-hydrochlorothiazide (PRINZIDE,ZESTORETIC) 20-25 MG per tablet Take 1 tablet by mouth daily.    [provider]  metFORMIN (GLUCOPHAGE) 1000 MG tablet Take 1,000 mg by mouth 2 (two) times daily with a meal.    [provider]  omeprazole (PRILOSEC) 20 MG capsule Take 20 mg by mouth daily.    [provider]    Allergies Ivp dye [iodinated diagnostic agents]  No family history on file.  Social History Social History  Substance Use Topics  . Smoking status: Former Smoker    Quit date: 10/15/2000  . Smokeless tobacco: Not on file  . Alcohol use No    Review of Systems  Constitutional: No fever/chills. Eyes: No visual changes. ENT: No sore throat. Cardiovascular: positive for chest pain. Respiratory: Denies shortness of breath. Gastrointestinal:  No abdominal pain.  No nausea, no vomiting.  No diarrhea.  No constipation. Genitourinary: Negative for dysuria. Musculoskeletal: Negative for back pain. Skin: Negative for rash. Neurological: Negative for headaches, focal weakness or numbness.   ____________________________________________   PHYSICAL EXAM:  VITAL SIGNS: ED Triage Vitals  Enc Vitals Group     BP 06/07/17 0017 (!) 186/79     Pulse Rate 06/07/17 0017 (!) 58     Resp 06/07/17  0017 17     Temp 06/07/17 0017 97.7 F (36.5 C)     Temp Source 06/07/17 0017 Oral     SpO2 06/07/17 0017 100 %     Weight 06/07/17 0017 185 lb (83.9 kg)     Height 06/07/17 0017 5\' 11"  (1.803 m)     Head Circumference --      Peak Flow --      Pain Score 06/07/17 0016 7     Pain Loc --      Pain Edu? --      Excl. in GC? --     Constitutional: Alert and oriented. Well appearing and in mild acute distress. Eyes: Conjunctivae are normal. PERRL. EOMI. Head: Atraumatic. Nose: No congestion/rhinnorhea. Mouth/Throat: Mucous membranes are moist.  Oropharynx non-erythematous. Neck: No stridor.   Cardiovascular: Normal rate, regular rhythm. Grossly normal heart sounds.  Good peripheral circulation. Respiratory: Normal respiratory effort.  No retractions. Lungs CTAB. Gastrointestinal: Soft and mildly tender to palpation epigastrium without rebound or guarding. No distention. No abdominal bruits. No CVA tenderness. Musculoskeletal: No lower extremity tenderness nor edema.  No joint effusions. Neurologic:  Normal speech and language. No gross focal neurologic deficits are appreciated. No gait instability. Skin:  Skin is warm, dry and intact. No rash noted. Psychiatric: Mood and affect are normal. Speech and behavior are normal.  ____________________________________________   LABS (all labs ordered are listed, but only abnormal results are displayed)  Labs Reviewed  BASIC METABOLIC PANEL - Abnormal; Notable for the following:       Result Value   Chloride 96 (*)    Glucose, Bld 164 (*)    Creatinine, Ser 1.44 (*)    GFR calc non Af Amer 48 (*)    GFR calc Af Amer 55 (*)    All other components within normal limits  CBC - Abnormal; Notable for the following:    WBC 13.1 (*)    MCV 79.8 (*)    RDW 16.5 (*)    All other components within normal limits  HEPATIC FUNCTION PANEL - Abnormal; Notable for the following:    Bilirubin, Direct <0.1 (*)    All other components within normal  limits  TROPONIN I  LIPASE, BLOOD  ETHANOL   ____________________________________________  EKG  ED ECG REPORT I, SUNG,JADE J, the attending physician, personally viewed and interpreted this ECG.   Date: 06/07/2017  EKG Time: 0010  Rate: 57  Rhythm: sinus bradycardia  Axis: normal  Intervals:none  ST&T Change: nonspecific  ____________________________________________  RADIOLOGY  Dg Chest 2 View  Result Date: 06/07/2017 CLINICAL DATA:  Central non radiating chest pain. EXAM: CHEST  2 VIEW COMPARISON:  10/08/2013 FINDINGS: Hyperinflation and chronic interstitial coarsening. No airspace consolidation. No effusion. Normal hilar, mediastinal and cardiac contours. IMPRESSION: Hyperinflation and chronic appearing interstitial coarsening. No acute cardiopulmonary finding. Electronically Signed   By: Ellery Plunk M.D.   On: 06/07/2017 01:30   [place] Abdomen Limited Ruq  Result Date: 06/07/2017 CLINICAL DATA:  71 y/o  M; 6 hours of  epigastric pain. EXAM: ULTRASOUND ABDOMEN LIMITED RIGHT UPPER QUADRANT COMPARISON:  None. FINDINGS: Gallbladder: Sludge and small stones. The largest stone measures up to 5 mm. No gallbladder wall thickening or pericholecystic fluid. Negative sonographic Murphy's sign. Common bile duct: Diameter: 3.8 mm Liver: Increased liver echogenicity compatible with steatosis. Portal vein is patent on color Doppler imaging with normal direction of blood flow towards the liver. IMPRESSION: 1. Gallbladder sludge and small stones. No secondary signs of acute cholecystitis. 2. Hepatic steatosis. Electronically Signed   By: Mitzi Hansen M.D.   On: 06/07/2017 04:19    ____________________________________________   PROCEDURES  Procedure(s) performed: None  Procedures  Critical Care performed: Yes, see critical care note(s)   CRITICAL CARE Performed by: Irean Hong   Total critical care time: 30 minutes  Critical care time was exclusive of separately billable  procedures and treating other patients.  Critical care was necessary to treat or prevent imminent or life-threatening deterioration.  Critical care was time spent personally by me on the following activities: development of treatment plan with patient and/or surrogate as well as nursing, discussions with consultants, evaluation of patient's response to treatment, examination of patient, obtaining history from patient or surrogate, ordering and performing treatments and interventions, ordering and review of laboratory studies, ordering and review of radiographic studies, pulse oximetry and re-evaluation of patient's condition.  ____________________________________________   INITIAL IMPRESSION / ASSESSMENT AND PLAN / ED COURSE  Pertinent labs & imaging results that were available during my care of the patient were reviewed by me and considered in my medical decision making (see chart for details).  71 year old male with CAD, hypertension, diabetes, CHF presenting with central chest aching sensation after eating a hamburger.Will check cardiac and abdominal panel, chest x-ray, administer IV analgesia and reassess.  Clinical Course as of Jun 07 428  Wynelle Link Jun 07, 2017  0143 pain returning. Will repeat dose morphine. Laboratory results noted. Will obtain ultrasound to evaluate for cholelithiasis.  [JS]  [id number] Updated patient and spouse of ultrasound results. Will discuss with hospitalist evaluate patient in the emergency department for admission of chest pain observation.  [JS]    Clinical Course User Index [JS] Irean Hong, MD     ____________________________________________   FINAL CLINICAL IMPRESSION(S) / ED DIAGNOSES  Final diagnoses:  Chest pain, unspecified type  Essential hypertension  Calculus of gallbladder without cholecystitis without obstruction      NEW MEDICATIONS STARTED DURING THIS VISIT:  New Prescriptions   No medications on file     Note:  This document was  prepared using Dragon voice recognition software and may include unintentional dictation errors.    Irean Hong, MD 06/07/17 [phone]

## 2017-06-07 NOTE — Discharge Summary (Signed)
[employer] [facility] [place] [facility]   PATIENT NAME: Ernest Carpenter    MR#:  [id number]  DATE OF BIRTH:  01-18-1946  DATE OF ADMISSION:  06/07/2017 ADMITTING PHYSICIAN: Ihor Austin, MD  DATE OF DISCHARGE: 06/07/2017 12:19 PM  PRIMARY CARE PHYSICIAN: Cheryll Dessert, FNP    ADMISSION DIAGNOSIS:  Calculus of gallbladder without cholecystitis without obstruction [K80.20] Essential hypertension [I10] Chest pain, unspecified type [R07.9]  DISCHARGE DIAGNOSIS:  Active Problems:   Chest pain   SECONDARY DIAGNOSIS:   Past Medical History:  Diagnosis Date  . Anginal pain (HCC)   . CHF (congestive heart failure) (HCC)   . Coronary artery disease   . Diabetes mellitus without complication (HCC)   . Hypertension   . Myocardial infarction (HCC)   . Neuromuscular disorder [facility])     HOSPITAL COURSE:   1.  Atypical chest pain with history of CAD. Chest pain described as burning. He also had a lot of belching. The patient follows with a cardiologist over at [facility]. 3 cardiac enzymes were negative. This is unlikely myocardial infarction. Patient feeling much better now. GERD could be a reason for his chest discomfort. Increase PPI to twice a day. Unfortunately on the holiday weekend we do not have stress test or cardiac catheterizations available. Follow-up as outpatient since he is doing better and cardiac enzymes are negative. Patient on aspirin Plavix atenolol and Lipitor. 2. Gallstones. Seen in consultation by surgery and no indications for surgery at this time. 3. Type 2 diabetes mellitus. Kept on His Usual Regimen 4. Essential hypertension continue usual medications 5. History of diastolic congestive heart failure. No signs of heart failure on this hospital stay 6. Hyperlipidemia unspecified on atorvastatin 7. History of Neuromuscular disorder   DISCHARGE CONDITIONS:   satisfactory  CONSULTS OBTAINED:  Treatment Team:  Lattie Haw, MD  DRUG ALLERGIES:    Allergies  Allergen Reactions  . Ivp Dye [Iodinated Diagnostic Agents] Rash    DISCHARGE MEDICATIONS:   Discharge Medication List as of 06/07/2017 11:34 AM    START taking these medications   Details  oxyCODONE (ROXICODONE) 5 MG immediate release tablet Take 1 tablet (5 mg total) by mouth every 8 (eight) hours as needed., Starting Sun 06/07/2017, Until Mon 06/07/2018, Print    pantoprazole (PROTONIX) 40 MG tablet Take 1 tablet (40 mg total) by mouth 2 (two) times daily., Starting Sun 06/07/2017, Print      CONTINUE these medications which have NOT CHANGED   Details  acetaminophen (TYLENOL) 500 MG tablet Take 1,000 mg by mouth every 8 (eight) hours as needed for mild pain or moderate pain., Until Discontinued, Historical Med    aspirin 81 MG tablet Take 81 mg by mouth daily., Until Discontinued, Historical Med    atenolol (TENORMIN) 25 MG tablet 25 mg., Until Discontinued, Historical Med    atorvastatin (LIPITOR) 10 MG tablet Take 10 mg by mouth daily., Until Discontinued, Historical Med    clopidogrel (PLAVIX) 75 MG tablet Take 75 mg by mouth daily., Until Discontinued, Historical Med    gabapentin (NEURONTIN) 600 MG tablet Take 600 mg by mouth 2 (two) times daily., Until Discontinued, Historical Med    glipiZIDE (GLUCOTROL) 10 MG tablet Take 10 mg by mouth 2 (two) times daily before a meal., Until Discontinued, Historical Med    lisinopril-hydrochlorothiazide (PRINZIDE,ZESTORETIC) 20-25 MG per tablet Take 1 tablet by mouth daily., Until Discontinued, Historical Med    metFORMIN (GLUCOPHAGE) 1000 MG tablet Take 1,000 mg by mouth 2 (two)  times daily with a meal., Until Discontinued, Historical Med      STOP taking these medications     ibuprofen (ADVIL,MOTRIN) 200 MG tablet      omeprazole (PRILOSEC) 20 MG capsule          DISCHARGE INSTRUCTIONS:   Follow-up PMD one week Follow-up cardiology 1 week  If you experience worsening of your admission symptoms, develop  shortness of breath, life threatening emergency, suicidal or homicidal thoughts you must seek medical attention immediately by calling 911 or calling your MD immediately  if symptoms less severe.  You Must read complete instructions/literature along with all the possible adverse reactions/side effects for all the Medicines you take and that have been prescribed to you. Take any new Medicines after you have completely understood and accept all the possible adverse reactions/side effects.   Please note  You were cared for by a hospitalist during your hospital stay. If you have any questions about your discharge medications or the care you received while you were in the hospital after you are discharged, you can call the unit and asked to speak with the hospitalist on call if the hospitalist that took care of you is not available. Once you are discharged, your primary care physician will handle any further medical issues. Please note that NO REFILLS for any discharge medications will be authorized once you are discharged, as it is imperative that you return to your primary care physician (or establish a relationship with a primary care physician if you do not have one) for your aftercare needs so that they can reassess your need for medications and monitor your lab values.    Today   CHIEF COMPLAINT:   Chief Complaint  Patient presents with  . Chest Pain    HISTORY OF PRESENT ILLNESS:  Ernest Carpenter  is a 71 y.o. male with a known history of came in with chest pain   VITAL SIGNS:  Blood pressure (!) 171/82, pulse 68, temperature (!) 97.4 F (36.3 C), temperature source Oral, resp. rate 16, height 5\' 11"  (1.803 m), weight 80.6 kg (177 lb 12.8 oz), SpO2 99 %.  BBlood pressure prior 159/74 PHYSICAL EXAMINATION:  GENERAL:  71 y.o.-year-old patient lying in the bed with no acute distress.  EYES: Pupils equal, round, reactive to light and accommodation. No scleral icterus. Extraocular muscles  intact.  HEENT: Head atraumatic, normocephalic. Oropharynx and nasopharynx clear.  NECK:  Supple, no jugular venous distention. No thyroid enlargement, no tenderness.  LUNGS: Normal breath sounds bilaterally, no wheezing, rales,rhonchi or crepitation. No use of accessory muscles of respiration.  CARDIOVASCULAR: S1, S2 normal. No murmurs, rubs, or gallops.  ABDOMEN: Soft, non-tender, non-distended. Bowel sounds present. No organomegaly or mass.  EXTREMITIES: No pedal edema, cyanosis, or clubbing.  NEUROLOGIC: Cranial nerves II through XII are intact. Muscle strength 5/5 in all extremities. Sensation intact. Gait not checked.  PSYCHIATRIC: The patient is alert and oriented x 3.  SKIN: No obvious rash, lesion, or ulcer.   DATA REVIEW:   CBC  Recent Labs Lab 06/07/17 0557  WBC 14.8*  HGB 13.8  HCT 41.5  PLT 292    Chemistries   Recent Labs Lab 06/07/17 0018 06/07/17 0557  NA 136 135  K 4.3 3.8  CL 96* 97*  CO2 29 30  GLUCOSE 164* 145*  BUN 16 17  CREATININE 1.44* 1.24  CALCIUM 9.4 8.9  AST 28  --   ALT 20  --   ALKPHOS 85  --  BILITOT 0.9  --     Cardiac Enzymes  Recent Labs Lab 06/07/17 0943  TROPONINI 0.03*    RADIOLOGY:  Dg Chest 2 View  Result Date: 06/07/2017 CLINICAL DATA:  Central non radiating chest pain. EXAM: CHEST  2 VIEW COMPARISON:  10/08/2013 FINDINGS: Hyperinflation and chronic interstitial coarsening. No airspace consolidation. No effusion. Normal hilar, mediastinal and cardiac contours. IMPRESSION: Hyperinflation and chronic appearing interstitial coarsening. No acute cardiopulmonary finding. Electronically Signed   By: Ellery Plunk M.D.   On: 06/07/2017 01:30   US Abdomen Limited Ruq  Result Date: 06/07/2017 CLINICAL DATA:  71 y/o  M; 6 hours of epigastric pain. EXAM: ULTRASOUND ABDOMEN LIMITED RIGHT UPPER QUADRANT COMPARISON:  None. FINDINGS: Gallbladder: Sludge and small stones. The largest stone measures up to 5 mm. No gallbladder wall  thickening or pericholecystic fluid. Negative sonographic Murphy's sign. Common bile duct: Diameter: 3.8 mm Liver: Increased liver echogenicity compatible with steatosis. Portal vein is patent on color Doppler imaging with normal direction of blood flow towards the liver. IMPRESSION: 1. Gallbladder sludge and small stones. No secondary signs of acute cholecystitis. 2. Hepatic steatosis. Electronically Signed   By: Mitzi Hansen M.D.   On: 06/07/2017 04:19    Management plans discussed with the patient, family and they are in agreement.  CODE STATUS:     Code Status Orders        Start     Ordered   06/07/17 0559  Full code  Continuous     06/07/17 0558    Code Status History    Date Active Date Inactive Code Status Order ID Comments User Context   This patient has a current code status but no historical code status.      TOTAL TIME TAKING CARE OF THIS PATIENT: 35 minutes.    Alford Highland M.D on 06/07/2017 at 2:53 PM  Between 7am to 6pm - Pager - [phone]  After 6pm go to [email or web address] - password [email or web address]  [employer] Office  [phone]  CC: Primary care physician; Cheryll Dessert, FNP

## 2018-07-30 IMAGING — CR DG CHEST 2V
2 series · 2 of 2 positions shown · non-contrast
Comparison: 10/08/2013

CLINICAL DATA: Central non radiating chest pain.

EXAM:
CHEST  2 VIEW

[chest pa]
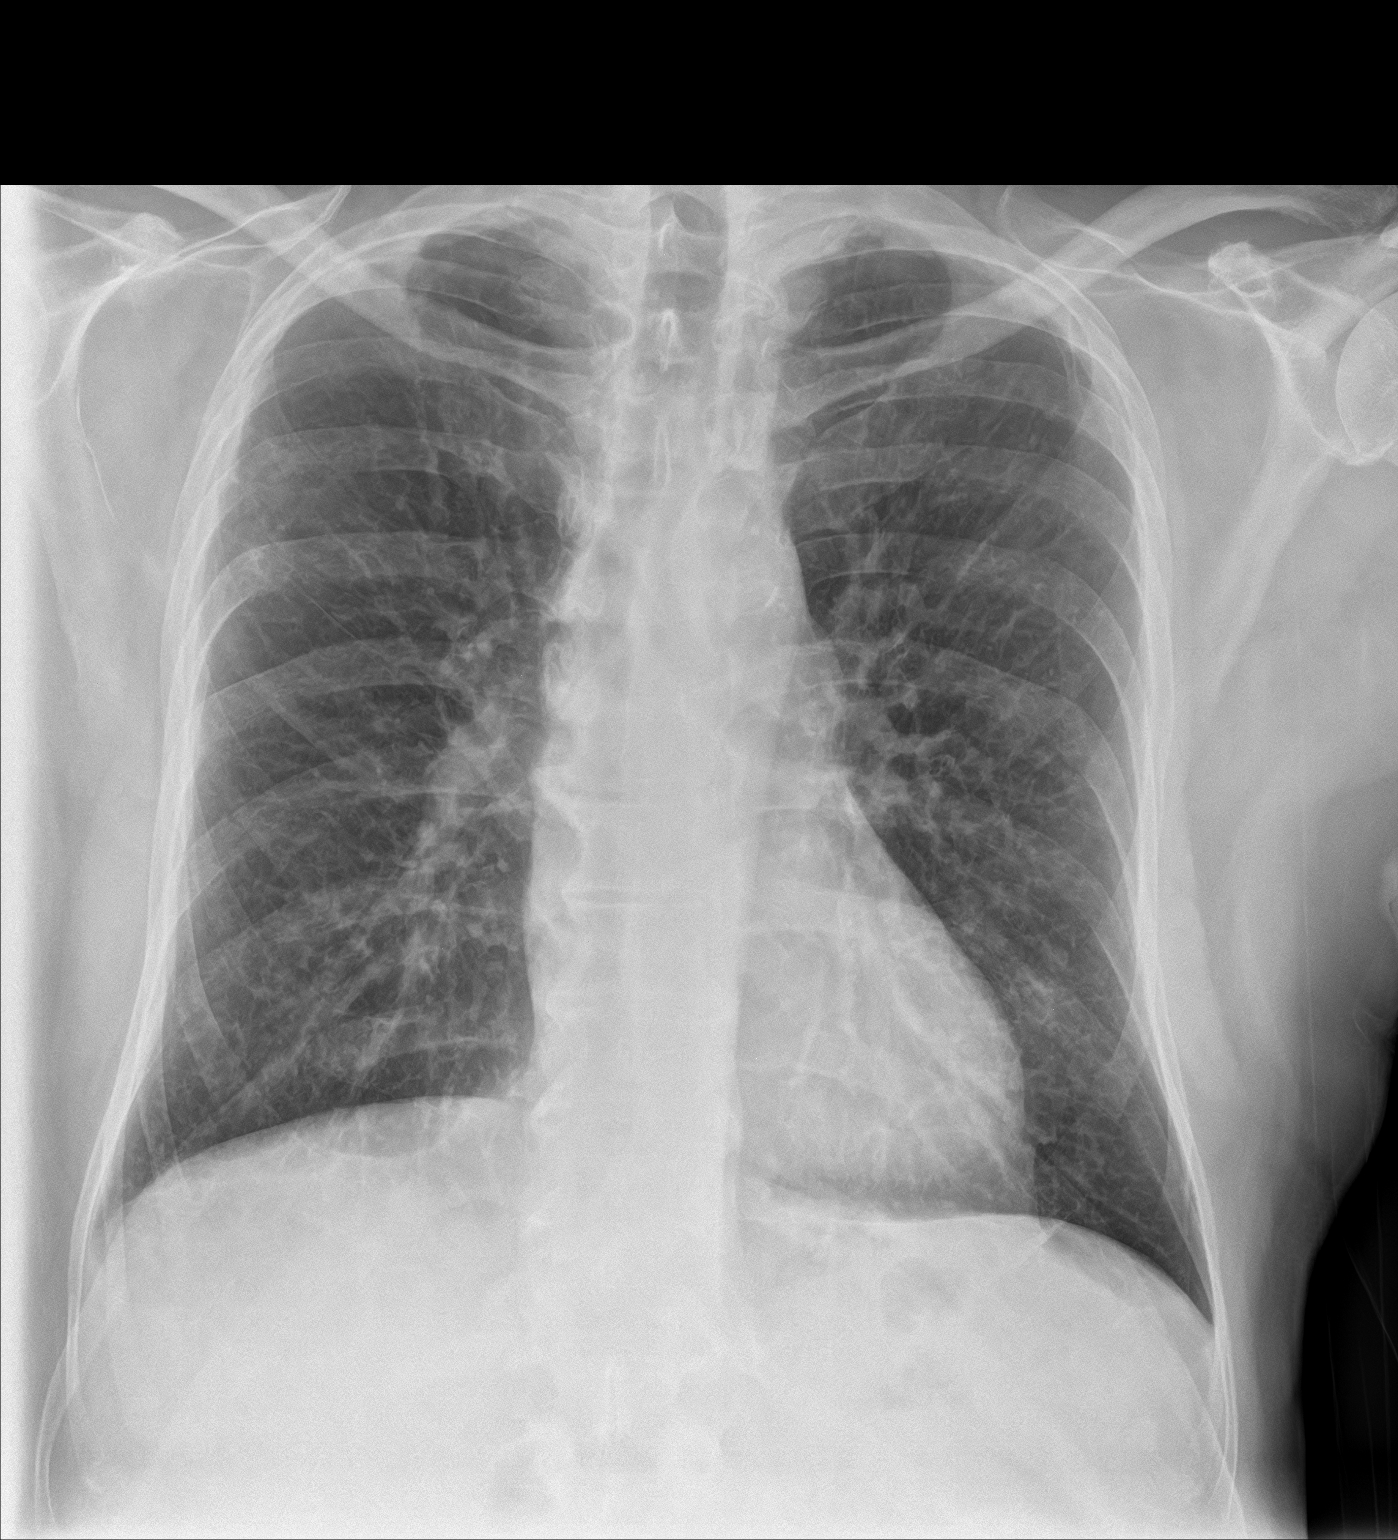

[chest lat]
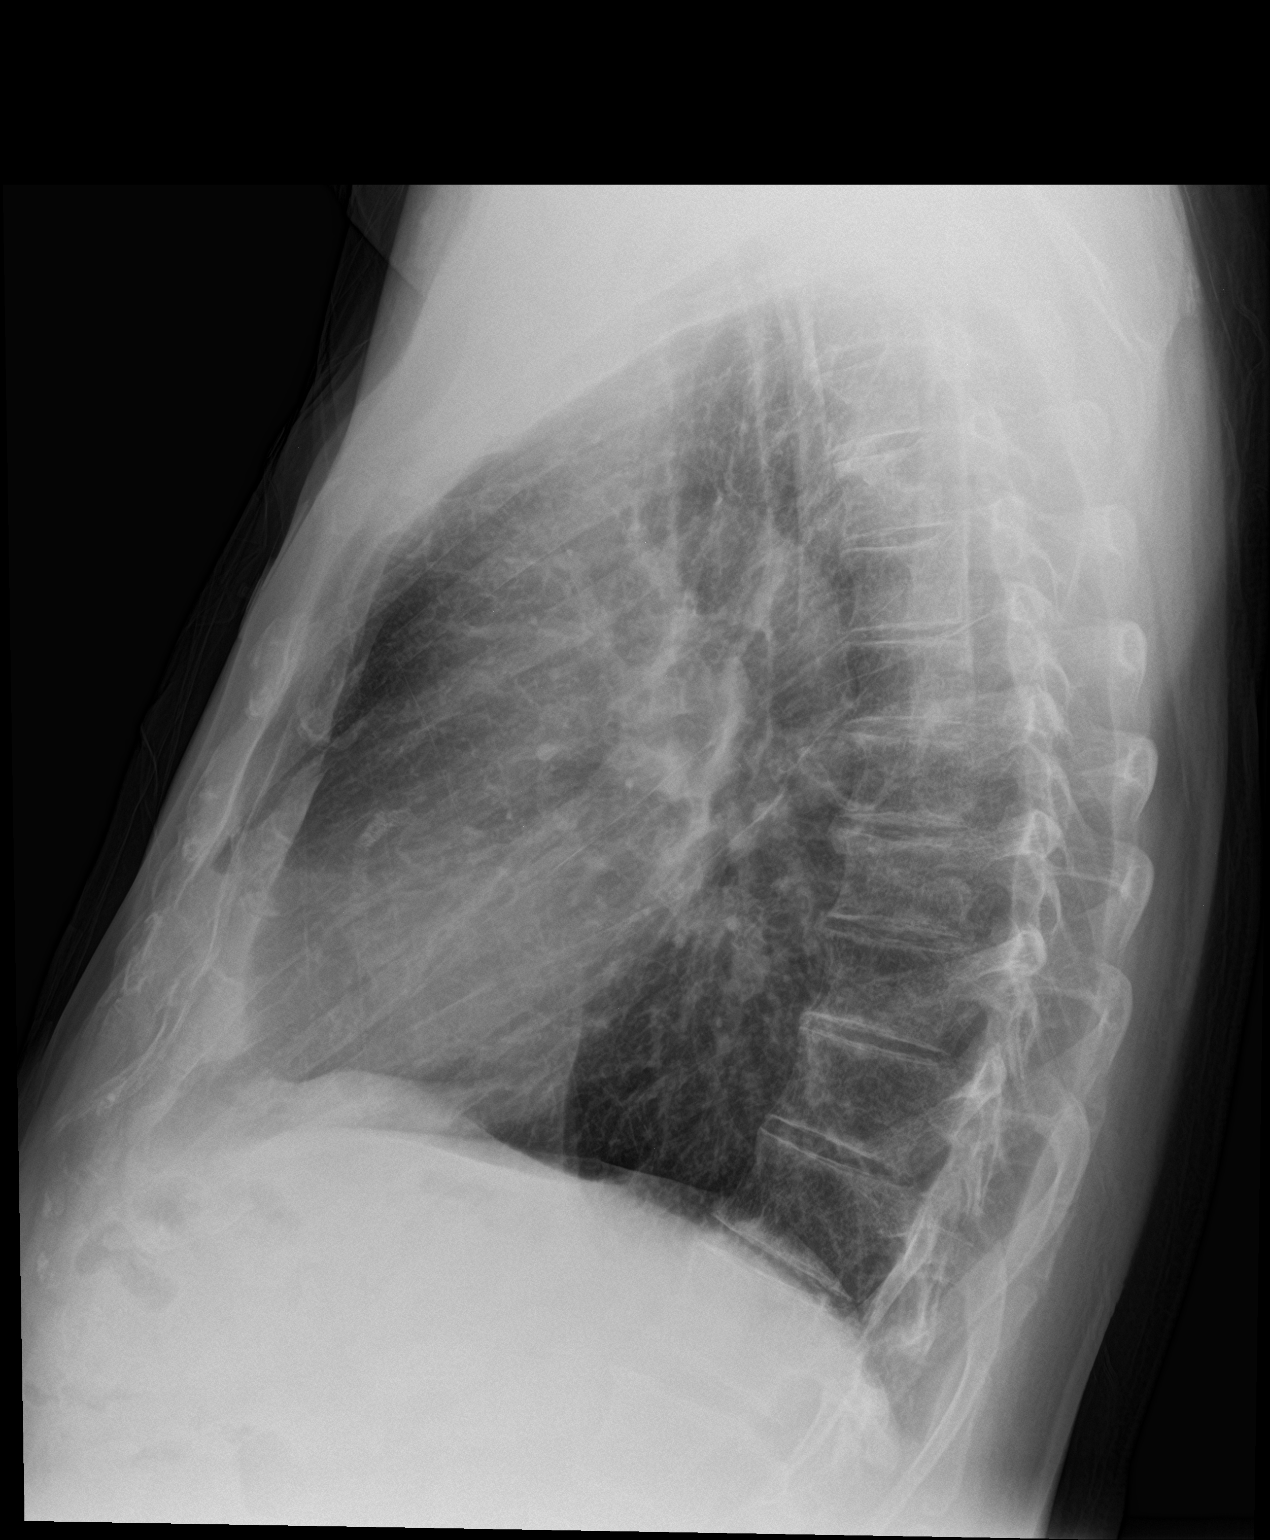

[2 of 2 positions shown; findings below may reference images not displayed]

FINDINGS: Hyperinflation and chronic interstitial coarsening. No airspace
consolidation. No effusion. Normal hilar, mediastinal and cardiac
contours.
IMPRESSION: Hyperinflation and chronic appearing interstitial coarsening. No
acute cardiopulmonary finding.

## 2019-06-08 ENCOUNTER — Other Ambulatory Visit: Payer: Medicare Other

## 2019-06-08 ENCOUNTER — Ambulatory Visit
Admission: RE | Admit: 2019-06-08 | Discharge: 2019-06-08 | Disposition: A | Discharge status: Home / Self Care | Payer: Medicare Other | Source: Ambulatory Visit | Attending: Gastroenterology | Admitting: Gastroenterology

## 2019-06-08 ENCOUNTER — Ambulatory Visit (HOSPITAL_COMMUNITY): Payer: Medicare Other

## 2019-06-08 ENCOUNTER — Other Ambulatory Visit: Payer: Self-pay | Admitting: Gastroenterology

## 2019-06-08 ENCOUNTER — Other Ambulatory Visit: Payer: Self-pay

## 2019-06-08 DIAGNOSIS — R1310 Dysphagia, unspecified: Secondary | ICD-10-CM

## 2019-06-17 ENCOUNTER — Ambulatory Visit: Admit: 2019-06-17 | Payer: Medicare Other | Admitting: Gastroenterology

## 2019-06-17 SURGERY — EGD (ESOPHAGOGASTRODUODENOSCOPY)
Anesthesia: General

## 2021-10-18 DIAGNOSIS — Z951 Presence of aortocoronary bypass graft: Secondary | ICD-10-CM | POA: Insufficient documentation

## 2021-11-01 DIAGNOSIS — I509 Heart failure, unspecified: Secondary | ICD-10-CM | POA: Insufficient documentation

## 2023-05-12 ENCOUNTER — Encounter
Admission: RE | Disposition: A | Discharge status: Home / Self Care | Payer: Self-pay | Source: Home / Self Care | Attending: Gastroenterology

## 2023-05-12 ENCOUNTER — Ambulatory Visit: Payer: Medicare HMO | Admitting: Anesthesiology

## 2023-05-12 ENCOUNTER — Ambulatory Visit
Admission: RE | Admit: 2023-05-12 | Discharge: 2023-05-12 | Disposition: A | Discharge status: Home / Self Care | Payer: Medicare HMO | Attending: Gastroenterology | Admitting: Gastroenterology

## 2023-05-12 ENCOUNTER — Other Ambulatory Visit: Payer: Self-pay

## 2023-05-12 ENCOUNTER — Encounter: Payer: Self-pay | Admitting: *Deleted

## 2023-05-12 DIAGNOSIS — I252 Old myocardial infarction: Secondary | ICD-10-CM | POA: Diagnosis not present

## 2023-05-12 DIAGNOSIS — E119 Type 2 diabetes mellitus without complications: Secondary | ICD-10-CM | POA: Insufficient documentation

## 2023-05-12 DIAGNOSIS — Z7902 Long term (current) use of antithrombotics/antiplatelets: Secondary | ICD-10-CM | POA: Insufficient documentation

## 2023-05-12 DIAGNOSIS — Z7984 Long term (current) use of oral hypoglycemic drugs: Secondary | ICD-10-CM | POA: Diagnosis not present

## 2023-05-12 DIAGNOSIS — R1310 Dysphagia, unspecified: Secondary | ICD-10-CM | POA: Diagnosis present

## 2023-05-12 DIAGNOSIS — K222 Esophageal obstruction: Secondary | ICD-10-CM | POA: Insufficient documentation

## 2023-05-12 DIAGNOSIS — K449 Diaphragmatic hernia without obstruction or gangrene: Secondary | ICD-10-CM | POA: Diagnosis not present

## 2023-05-12 DIAGNOSIS — I110 Hypertensive heart disease with heart failure: Secondary | ICD-10-CM | POA: Diagnosis not present

## 2023-05-12 DIAGNOSIS — I2510 Atherosclerotic heart disease of native coronary artery without angina pectoris: Secondary | ICD-10-CM | POA: Diagnosis not present

## 2023-05-12 DIAGNOSIS — I509 Heart failure, unspecified: Secondary | ICD-10-CM | POA: Diagnosis not present

## 2023-05-12 HISTORY — PX: BALLOON DILATION: SHX5330

## 2023-05-12 HISTORY — PX: ESOPHAGOGASTRODUODENOSCOPY (EGD) WITH PROPOFOL: SHX5813

## 2023-05-12 HISTORY — PX: SAVORY DILATION: SHX5439

## 2023-05-12 LAB — GLUCOSE, CAPILLARY: Glucose-Capillary: 149 mg/dL — ABNORMAL HIGH (ref 70–99)

## 2023-05-12 SURGERY — ESOPHAGOGASTRODUODENOSCOPY (EGD) WITH PROPOFOL
Anesthesia: General

## 2023-05-12 MED ORDER — PROPOFOL 500 MG/50ML IV EMUL
INTRAVENOUS | Status: DC | PRN
  Administered 2023-05-12: 145 ug/kg/min via INTRAVENOUS

## 2023-05-12 MED ORDER — LIDOCAINE HCL (CARDIAC) PF 100 MG/5ML IV SOSY
PREFILLED_SYRINGE | INTRAVENOUS | Status: DC | PRN
  Administered 2023-05-12: 100 mg via INTRAVENOUS

## 2023-05-12 MED ORDER — PROPOFOL 10 MG/ML IV BOLUS
INTRAVENOUS | Status: DC | PRN
Start: 2023-05-12 — End: 2023-05-12
  Administered 2023-05-12: 60 mg via INTRAVENOUS
  Administered 2023-05-12: 20 mg via INTRAVENOUS

## 2023-05-12 MED ORDER — SODIUM CHLORIDE 0.9 % IV SOLN: INTRAVENOUS | Status: DC

## 2023-05-12 NOTE — H&P (Signed)
Outpatient short stay form Pre-procedure 05/12/2023  Regis Bill, MD  Primary Physician: Luciana Axe, NP  Reason for visit:  Dysphagia  History of present illness:    77 y/o gentleman with history of hypertension and DM II here for EGD for solid food dysphagia. Had dilation in 2020 of an upper and lower stricture to 12 mm. He denies blood thinners. No family history of GI malignancies.    Current Facility-Administered Medications:    0.9 %  sodium chloride infusion, , Intravenous, Continuous, Kevin Space, Rossie Muskrat, MD, Last Rate: 20 mL/hr at 05/12/23 1031, New Bag at 05/12/23 1031  Medications Prior to Admission  Medication Sig Dispense Refill Last Dose   acetaminophen (TYLENOL) 500 MG tablet Take 1,000 mg by mouth every 8 (eight) hours as needed for mild pain or moderate pain.      aspirin 81 MG tablet Take 81 mg by mouth daily.      atenolol (TENORMIN) 25 MG tablet 25 mg.      atorvastatin (LIPITOR) 10 MG tablet Take 10 mg by mouth daily.      clopidogrel (PLAVIX) 75 MG tablet Take 75 mg by mouth daily.      gabapentin (NEURONTIN) 600 MG tablet Take 600 mg by mouth 2 (two) times daily.      glipiZIDE (GLUCOTROL) 10 MG tablet Take 10 mg by mouth 2 (two) times daily before a meal.      lisinopril-hydrochlorothiazide (PRINZIDE,ZESTORETIC) 20-25 MG per tablet Take 1 tablet by mouth daily.      metFORMIN (GLUCOPHAGE) 1000 MG tablet Take 1,000 mg by mouth 2 (two) times daily with a meal.      pantoprazole (PROTONIX) 40 MG tablet Take 1 tablet (40 mg total) by mouth 2 (two) times daily. 60 tablet 0      Allergies  Allergen Reactions   Ivp Dye [Iodinated Contrast Media] Rash     Past Medical History:  Diagnosis Date   Anginal pain (HCC)    CHF (congestive heart failure) (HCC)    Coronary artery disease    Diabetes mellitus without complication (HCC)    Hypertension    Myocardial infarction [facility])    Neuromuscular disorder (HCC)     Review of systems:  Otherwise  negative.    Physical Exam  Gen: Alert, oriented. Appears stated age.  HEENT: PERRLA. Lungs: No respiratory distress CV: RRR Abd: soft, benign, no masses Ext: No edema    Planned procedures: Proceed with EGD. The patient understands the nature of the planned procedure, indications, risks, alternatives and potential complications including but not limited to bleeding, infection, perforation, damage to internal organs and possible oversedation/side effects from anesthesia. The patient agrees and gives consent to proceed.  Please refer to procedure notes for findings, recommendations and patient disposition/instructions.     Regis Bill, MD [facility]

## 2023-05-12 NOTE — Transfer of Care (Signed)
Immediate Anesthesia Transfer of Care Note  Patient: Ernest Carpenter  Procedure(s) Performed: ESOPHAGOGASTRODUODENOSCOPY (EGD) WITH PROPOFOL BALLOON DILATION SAVORY DILATION  Patient Location: Endoscopy Unit  Anesthesia Type:General  Level of Consciousness: drowsy and patient cooperative  Airway & Oxygen Therapy: Patient Spontanous Breathing and Patient connected to face mask oxygen  Post-op Assessment: Report given to RN and Post -op Vital signs reviewed and stable  Post vital signs: Reviewed and stable  Last Vitals:  Vitals Value Taken Time  BP 89/58 05/12/23 1059  Temp    Pulse 63 05/12/23 1105  Resp 16 05/12/23 1055  SpO2 100 % 05/12/23 1105  Vitals shown include unfiled device data.  Last Pain:  Vitals:   05/12/23 1055  TempSrc:   PainSc: 0-No pain         Complications: No notable events documented.

## 2023-05-12 NOTE — Interval H&P Note (Signed)
History and Physical Interval Note:  05/12/2023 10:36 AM  Ernest Carpenter  has presented today for surgery, with the diagnosis of pharnygoesophageal dysphagia.  The various methods of treatment have been discussed with the patient and family. After consideration of risks, benefits and other options for treatment, the patient has consented to  Procedure(s): ESOPHAGOGASTRODUODENOSCOPY (EGD) WITH PROPOFOL (N/A) as a surgical intervention.  The patient's history has been reviewed, patient examined, no change in status, stable for surgery.  I have reviewed the patient's chart and labs.  Questions were answered to the patient's satisfaction.     Regis Bill  Ok to proceed with EGD

## 2023-05-12 NOTE — Anesthesia Preprocedure Evaluation (Signed)
Anesthesia Evaluation  Patient identified by MRN, date of birth, ID band Patient awake    Reviewed: Allergy & Precautions, NPO status , Patient's Chart, lab work & pertinent test results  History of Anesthesia Complications Negative for: history of anesthetic complications  Airway Mallampati: III  TM Distance: <3 FB Neck ROM: full    Dental  (+) Upper Dentures, Lower Dentures   Pulmonary neg shortness of breath, former smoker   Pulmonary exam normal        Cardiovascular Exercise Tolerance: Good hypertension, (-) angina + CAD, + Past MI, + Cardiac Stents, + CABG and +CHF  Normal cardiovascular exam     Neuro/Psych  Neuromuscular disease  negative psych ROS   GI/Hepatic negative GI ROS, Neg liver ROS,neg GERD  ,,  Endo/Other  diabetes, Type 2    Renal/GU negative Renal ROS  negative genitourinary   Musculoskeletal   Abdominal   Peds  Hematology negative hematology ROS (+)   Anesthesia Other Findings Patient reports that they do not think that any food or pills are stuck in their throat at this time.  Past Medical History: No date: Anginal pain (HCC) No date: CHF (congestive heart failure) (HCC) No date: Coronary artery disease No date: Diabetes mellitus without complication (HCC) No date: Hypertension No date: Myocardial infarction [facility]) No date: Neuromuscular disorder [facility])  Past Surgical History: No date: cardiac stents     Comment:  x3 02/13/2015: COLONOSCOPY; N/A     Comment:  Procedure: COLONOSCOPY;  Surgeon: Christena Deem, MD;              Location: [facility] ENDOSCOPY;  Service: Endoscopy;                Laterality: N/A; No date: CORONARY ANGIOPLASTY 02/13/2015: ESOPHAGOGASTRODUODENOSCOPY (EGD) WITH PROPOFOL     Comment:  Procedure: ESOPHAGOGASTRODUODENOSCOPY (EGD) WITH               PROPOFOL;  Surgeon: Christena Deem, MD;  Location:               [facility] ENDOSCOPY;  Service: Endoscopy;; No date:  TOE AMPUTATION; Right No date: TONSILLECTOMY No date: UPPER GI ENDOSCOPY     Reproductive/Obstetrics negative OB ROS                             Anesthesia Physical Anesthesia Plan  ASA: 3  Anesthesia Plan: General   Post-op Pain Management:    Induction: Intravenous  PONV Risk Score and Plan: Propofol infusion and TIVA  Airway Management Planned: Natural Airway and Nasal Cannula  Additional Equipment:   Intra-op Plan:   Post-operative Plan:   Informed Consent: I have reviewed the patients History and Physical, chart, labs and discussed the procedure including the risks, benefits and alternatives for the proposed anesthesia with the patient or authorized representative who has indicated his/her understanding and acceptance.     Dental Advisory Given  Plan Discussed with: Anesthesiologist, CRNA and Surgeon  Anesthesia Plan Comments: (Patient consented for risks of anesthesia including but not limited to:  - adverse reactions to medications - risk of airway placement if required - damage to eyes, teeth, lips or other oral mucosa - nerve damage due to positioning  - sore throat or hoarseness - Damage to heart, brain, nerves, lungs, other parts of body or loss of life  Patient voiced understanding.)       Anesthesia Quick Evaluation

## 2023-05-12 NOTE — Op Note (Signed)
[facility] Patient Name: Ernest Carpenter Procedure Date: 05/12/2023 10:25 AM MRN: [id number] Account #: [id number] Date of Birth: 12/12/1945 Admit Type: Outpatient Age: 77 Room: [facility] ENDO ROOM 1 Gender: Male Note Status: Finalized Instrument Name: Laurette Schimke [id number] Procedure:             Upper GI endoscopy Indications:           Dysphagia Providers:             Eather Colas MD, MD Referring MD:          Cheryll Dessert (Referring MD) Medicines:             Monitored Anesthesia Care Complications:         No immediate complications. Estimated blood loss:                         Minimal. Procedure:             Pre-Anesthesia Assessment:                        - Prior to the procedure, a History and Physical was                         performed, and patient medications and allergies were                         reviewed. The patient is competent. The risks and                         benefits of the procedure and the sedation options and                         risks were discussed with the patient. All questions                         were answered and informed consent was obtained.                         Patient identification and proposed procedure were                         verified by the physician, the nurse, the                         anesthesiologist, the anesthetist and the technician                         in the endoscopy suite. Mental Status Examination:                         alert and oriented. Airway Examination: normal                         oropharyngeal airway and neck mobility. Respiratory                         Examination: clear to auscultation. CV Examination:  normal. Prophylactic Antibiotics: The patient does not                         require prophylactic antibiotics. Prior                         Anticoagulants: The patient has taken no anticoagulant                         or  antiplatelet agents. ASA Grade Assessment: III - A                         patient with severe systemic disease. After reviewing                         the risks and benefits, the patient was deemed in                         satisfactory condition to undergo the procedure. The                         anesthesia plan was to use monitored anesthesia care                         (MAC). Immediately prior to administration of                         medications, the patient was re-assessed for adequacy                         to receive sedatives. The heart rate, respiratory                         rate, oxygen saturations, blood pressure, adequacy of                         pulmonary ventilation, and response to care were                         monitored throughout the procedure. The physical                         status of the patient was re-assessed after the                         procedure.                        After obtaining informed consent, the endoscope was                         passed under direct vision. Throughout the procedure,                         the patient's blood pressure, pulse, and oxygen                         saturations were monitored continuously. The Endoscope  was introduced through the mouth, and advanced to the                         second part of duodenum. The upper GI endoscopy was                         somewhat difficult due to narrowing. The patient                         tolerated the procedure well. Findings:      One benign-appearing, intrinsic severe (stenosis; an endoscope cannot       pass) stenosis was found in the upper third of the esophagus. This       stenosis measured 9 mm (inner diameter) x less than one cm (in length).       The stenosis was traversed after gentle pressure from the scope. A       balloon was initially used for dilation but due to how high the stenosis       was, it was unsuccessful so  decision was made to use a savary. A       guidewire was placed and the scope was withdrawn. Dilation was performed       with a Savary dilator with mild resistance at 14 mm. The dilation site       was examined following endoscope reinsertion and showed moderate       improvement in luminal narrowing. Estimated blood loss was minimal.      A mild Schatzki ring was found in the lower third of the esophagus. A       guidewire was placed and the scope was withdrawn. Dilation was performed       with a Savary dilator with no resistance at 14 mm. The dilation site was       examined following endoscope reinsertion and showed mild mucosal       disruption. Estimated blood loss was minimal.      A small hiatal hernia was present.      The entire examined stomach was normal.      The examined duodenum was normal. Impression:            - Benign-appearing esophageal stenosis. Dilated.                        - Mild Schatzki ring. Dilated.                        - Small hiatal hernia.                        - Normal stomach.                        - Normal examined duodenum.                        - No specimens collected. Recommendation:        - Discharge patient to home.                        - Resume previous diet.                        -  Continue present medications.                        - Repeat upper endoscopy in 3 months for retreatment.                        - Return to referring physician as previously                         scheduled. Procedure Code(s):     --- Professional ---                        [phone], Esophagogastroduodenoscopy, flexible,                         transoral; with insertion of guide wire followed by                         passage of dilator(s) through esophagus over guide wire Diagnosis Code(s):     --- Professional ---                        K22.2, Esophageal obstruction                        K44.9, Diaphragmatic hernia without obstruction or                          gangrene                        R13.10, Dysphagia, unspecified CPT copyright 2022 American Medical Association. All rights reserved. The codes documented in this report are preliminary and upon coder review may  be revised to meet current compliance requirements. Eather Colas MD, MD 05/12/2023 11:00:45 AM Number of Addenda: 0 Note Initiated On: 05/12/2023 10:25 AM Estimated Blood Loss:  Estimated blood loss was minimal.      [facility]

## 2023-05-12 NOTE — Anesthesia Procedure Notes (Signed)
Procedure Name: General with mask airway Date/Time: 05/12/2023 10:35 AM  Performed by: Mohammed Kindle, CRNAPre-anesthesia Checklist: Patient identified, Emergency Drugs available and Suction available Patient Re-evaluated:Patient Re-evaluated prior to induction Oxygen Delivery Method: Simple face mask Induction Type: IV induction Placement Confirmation: positive ETCO2, breath sounds checked- equal and bilateral and CO2 detector Dental Injury: Teeth and Oropharynx as per pre-operative assessment

## 2023-05-12 NOTE — Anesthesia Postprocedure Evaluation (Signed)
Anesthesia Post Note  Patient: Ernest Carpenter  Procedure(s) Performed: ESOPHAGOGASTRODUODENOSCOPY (EGD) WITH PROPOFOL BALLOON DILATION SAVORY DILATION  Patient location during evaluation: Endoscopy Anesthesia Type: General Level of consciousness: awake and alert Pain management: pain level controlled Vital Signs Assessment: post-procedure vital signs reviewed and stable Respiratory status: spontaneous breathing, nonlabored ventilation, respiratory function stable and patient connected to nasal cannula oxygen Cardiovascular status: blood pressure returned to baseline and stable Postop Assessment: no apparent nausea or vomiting Anesthetic complications: no   No notable events documented.   Last Vitals:  Vitals:   05/12/23 1133 05/12/23 1135  BP: 119/70 121/67  Pulse:    Resp: 16 16  Temp:    SpO2:      Last Pain:  Vitals:   05/12/23 1135  TempSrc:   PainSc: 0-No pain                 Cleda Mccreedy Divante Kotch

## 2023-05-13 ENCOUNTER — Encounter: Payer: Self-pay | Admitting: Gastroenterology

## 2023-08-05 ENCOUNTER — Encounter: Payer: Self-pay | Admitting: *Deleted

## 2023-08-17 ENCOUNTER — Encounter: Payer: Self-pay | Admitting: Gastroenterology

## 2023-08-17 ENCOUNTER — Ambulatory Visit
Admission: RE | Admit: 2023-08-17 | Discharge: 2023-08-17 | Disposition: A | Discharge status: Home / Self Care | Payer: Medicare HMO | Attending: Gastroenterology | Admitting: Gastroenterology

## 2023-08-17 ENCOUNTER — Encounter
Admission: RE | Disposition: A | Discharge status: Home / Self Care | Payer: Self-pay | Source: Home / Self Care | Attending: Gastroenterology

## 2023-08-17 ENCOUNTER — Ambulatory Visit: Payer: Medicare HMO | Admitting: General Practice

## 2023-08-17 DIAGNOSIS — I110 Hypertensive heart disease with heart failure: Secondary | ICD-10-CM | POA: Diagnosis not present

## 2023-08-17 DIAGNOSIS — K222 Esophageal obstruction: Secondary | ICD-10-CM | POA: Diagnosis present

## 2023-08-17 DIAGNOSIS — E119 Type 2 diabetes mellitus without complications: Secondary | ICD-10-CM | POA: Diagnosis not present

## 2023-08-17 HISTORY — PX: ESOPHAGOGASTRODUODENOSCOPY (EGD) WITH PROPOFOL: SHX5813

## 2023-08-17 HISTORY — PX: ESOPHAGEAL DILATION: SHX303

## 2023-08-17 LAB — GLUCOSE, CAPILLARY: Glucose-Capillary: 163 mg/dL — ABNORMAL HIGH (ref 70–99)

## 2023-08-17 SURGERY — ESOPHAGOGASTRODUODENOSCOPY (EGD) WITH PROPOFOL
Anesthesia: General

## 2023-08-17 MED ORDER — GLYCOPYRROLATE 0.2 MG/ML IJ SOLN
INTRAMUSCULAR | Status: DC | PRN
  Administered 2023-08-17: .2 mg via INTRAVENOUS

## 2023-08-17 MED ORDER — PROPOFOL 10 MG/ML IV BOLUS
INTRAVENOUS | Status: DC | PRN
Start: 2023-08-17 — End: 2023-08-17
  Administered 2023-08-17: 60 mg via INTRAVENOUS

## 2023-08-17 MED ORDER — SODIUM CHLORIDE 0.9 % IV SOLN
INTRAVENOUS | Status: DC
  Administered 2023-08-17: 20 mL/h via INTRAVENOUS

## 2023-08-17 MED ORDER — PROPOFOL 500 MG/50ML IV EMUL
INTRAVENOUS | Status: DC | PRN
  Administered 2023-08-17: 145 ug/kg/min via INTRAVENOUS

## 2023-08-17 MED ORDER — LIDOCAINE HCL (CARDIAC) PF 100 MG/5ML IV SOSY
PREFILLED_SYRINGE | INTRAVENOUS | Status: DC | PRN
  Administered 2023-08-17: 100 mg via INTRAVENOUS

## 2023-08-17 NOTE — Op Note (Signed)
[facility] Patient Name: Ernest Carpenter Procedure Date: 08/17/2023 9:01 AM MRN: [id number] Account #: [id number] Date of Birth: 09-28-1946 Admit Type: Outpatient Age: 77 Room: [facility] ROOM 3 Gender: Male Note Status: Finalized Instrument Name: Upper Endoscope [id number] Procedure:             Upper GI endoscopy Indications:           Stricture of the esophagus Providers:             Eather Colas MD, MD Referring MD:          No Local Md, MD (Referring MD) Medicines:             Monitored Anesthesia Care Complications:         No immediate complications. Estimated blood loss:                         Minimal. Procedure:             Pre-Anesthesia Assessment:                        - Prior to the procedure, a History and Physical was                         performed, and patient medications and allergies were                         reviewed. The patient is competent. The risks and                         benefits of the procedure and the sedation options and                         risks were discussed with the patient. All questions                         were answered and informed consent was obtained.                         Patient identification and proposed procedure were                         verified by the physician, the nurse, the                         anesthesiologist, the anesthetist and the technician                         in the endoscopy suite. Mental Status Examination:                         alert and oriented. Airway Examination: normal                         oropharyngeal airway and neck mobility. Respiratory                         Examination: clear to auscultation. CV Examination:  normal. Prophylactic Antibiotics: The patient does not                         require prophylactic antibiotics. Prior                         Anticoagulants: The patient has taken no anticoagulant                          or antiplatelet agents. ASA Grade Assessment: II - A                         patient with mild systemic disease. After reviewing                         the risks and benefits, the patient was deemed in                         satisfactory condition to undergo the procedure. The                         anesthesia plan was to use monitored anesthesia care                         (MAC). Immediately prior to administration of                         medications, the patient was re-assessed for adequacy                         to receive sedatives. The heart rate, respiratory                         rate, oxygen saturations, blood pressure, adequacy of                         pulmonary ventilation, and response to care were                         monitored throughout the procedure. The physical                         status of the patient was re-assessed after the                         procedure.                        After obtaining informed consent, the endoscope was                         passed under direct vision. Throughout the procedure,                         the patient's blood pressure, pulse, and oxygen                         saturations were monitored continuously. The Endoscope  was introduced through the mouth, and advanced to the                         second part of duodenum. The upper GI endoscopy was                         accomplished without difficulty. The patient tolerated                         the procedure well. Findings:      Two benign-appearing, intrinsic moderate (circumferential scarring or       stenosis; an endoscope may pass) stenoses were found. The narrowest       stenosis measured 1.1 cm (inner diameter) x less than one cm (in       length). The stenoses were traversed. A guidewire was placed and the       scope was withdrawn. Dilation was performed with a Savary dilator with       no resistance at 45 Fr. A  guidewire was placed and the scope was       withdrawn. Dilation was performed with a Savary dilator with mild       resistance at 48 Fr. The dilation site was examined following endoscope       reinsertion and showed moderate mucosal disruption. Estimated blood loss       was minimal.      The entire examined stomach was normal.      The examined duodenum was normal. Impression:            - Benign-appearing esophageal stenoses. Dilated.                        - Normal stomach.                        - Normal examined duodenum.                        - No specimens collected. Recommendation:        - Discharge patient to home.                        - Resume previous diet.                        - Continue present medications.                        - Return to referring physician as previously                         scheduled. Procedure Code(s):     --- Professional ---                        [phone], Esophagogastroduodenoscopy, flexible,                         transoral; with insertion of guide wire followed by                         passage of dilator(s) through esophagus over guide wire Diagnosis Code(s):     ---  Professional ---                        K22.2, Esophageal obstruction CPT copyright 2022 [employer]. All rights reserved. The codes documented in this report are preliminary and upon coder review may  be revised to meet current compliance requirements. Eather Colas MD, MD 08/17/2023 9:22:11 AM Number of Addenda: 0 Note Initiated On: 08/17/2023 9:01 AM Estimated Blood Loss:  Estimated blood loss was minimal.      [facility]

## 2023-08-17 NOTE — Transfer of Care (Signed)
Immediate Anesthesia Transfer of Care Note  Patient: Ernest Carpenter  Procedure(s) Performed: ESOPHAGOGASTRODUODENOSCOPY (EGD) WITH PROPOFOL ESOPHAGEAL DILATION  Patient Location: Endoscopy Unit  Anesthesia Type:General  Level of Consciousness: drowsy and patient cooperative  Airway & Oxygen Therapy: Patient Spontanous Breathing and Patient connected to face mask oxygen  Post-op Assessment: Report given to RN and Post -op Vital signs reviewed and stable  Post vital signs: Reviewed and stable  Last Vitals:  Vitals Value Taken Time  BP 122/60 08/17/23 0919  Temp 35.9 C 08/17/23 0919  Pulse 63 08/17/23 0925  Resp 20 08/17/23 0919  SpO2 100 % 08/17/23 0925  Vitals shown include unfiled device data.  Last Pain:  Vitals:   08/17/23 0919  TempSrc: Temporal  PainSc: Asleep         Complications: No notable events documented.

## 2023-08-17 NOTE — Anesthesia Preprocedure Evaluation (Addendum)
Anesthesia Evaluation  Patient identified by MRN, date of birth, ID band Patient awake    Reviewed: Allergy & Precautions, NPO status , Patient's Chart, lab work & pertinent test results  History of Anesthesia Complications Negative for: history of anesthetic complications  Airway Mallampati: III  TM Distance: >3 FB Neck ROM: full    Dental  (+) Chipped, Dental Advidsory Given   Pulmonary neg shortness of breath, former smoker   Pulmonary exam normal        Cardiovascular Exercise Tolerance: Good hypertension, (-) angina + CAD, + Past MI and +CHF  Normal cardiovascular exam     Neuro/Psych negative neurological ROS  negative psych ROS   GI/Hepatic negative GI ROS, Neg liver ROS,neg GERD  ,,  Endo/Other  diabetes, Type 2    Renal/GU negative Renal ROS  negative genitourinary   Musculoskeletal   Abdominal   Peds  Hematology negative hematology ROS (+)   Anesthesia Other Findings Past Medical History: No date: Anginal pain (HCC) No date: CHF (congestive heart failure) (HCC) No date: Coronary artery disease No date: Diabetes mellitus without complication (HCC) No date: Hypertension No date: Myocardial infarction (HCC) No date: Neuromuscular disorder (HCC)  Past Surgical History: 05/12/2023: BALLOON DILATION     Comment:  Procedure: BALLOON DILATION;  Surgeon: Regis Bill, MD;  Location: [facility] ENDOSCOPY;  Service: Endoscopy;; No date: cardiac stents     Comment:  x3 02/13/2015: COLONOSCOPY; N/A     Comment:  Procedure: COLONOSCOPY;  Surgeon: Christena Deem, MD;              Location: [facility] ENDOSCOPY;  Service: Endoscopy;                Laterality: N/A; No date: CORONARY ANGIOPLASTY 02/13/2015: ESOPHAGOGASTRODUODENOSCOPY (EGD) WITH PROPOFOL     Comment:  Procedure: ESOPHAGOGASTRODUODENOSCOPY (EGD) WITH               PROPOFOL;  Surgeon: Christena Deem, MD;  Location:               [facility]  ENDOSCOPY;  Service: Endoscopy;; 05/12/2023: ESOPHAGOGASTRODUODENOSCOPY (EGD) WITH PROPOFOL; N/A     Comment:  Procedure: ESOPHAGOGASTRODUODENOSCOPY (EGD) WITH               PROPOFOL;  Surgeon: Regis Bill, MD;  Location:               [facility] ENDOSCOPY;  Service: Endoscopy;  Laterality: N/A; 05/12/2023: SAVORY DILATION     Comment:  Procedure: SAVORY DILATION;  Surgeon: Regis Bill, MD;  Location: [facility] ENDOSCOPY;  Service: Endoscopy;; No date: TOE AMPUTATION; Right No date: TONSILLECTOMY No date: UPPER GI ENDOSCOPY  BMI    Body Mass Index: 21.79 kg/m      Reproductive/Obstetrics negative OB ROS                             Anesthesia Physical Anesthesia Plan  ASA: 2  Anesthesia Plan: General   Post-op Pain Management:    Induction: Intravenous  PONV Risk Score and Plan: Propofol infusion, TIVA and Ondansetron  Airway Management Planned: Natural Airway and Nasal Cannula  Additional Equipment:   Intra-op Plan:   Post-operative Plan:   Informed Consent: I have reviewed the patients History and Physical, chart, labs and discussed  the procedure including the risks, benefits and alternatives for the proposed anesthesia with the patient or authorized representative who has indicated his/her understanding and acceptance.     Dental Advisory Given  Plan Discussed with: Anesthesiologist, CRNA and Surgeon  Anesthesia Plan Comments: (Patient consented for risks of anesthesia including but not limited to:  - adverse reactions to medications - risk of airway placement if required - damage to eyes, teeth, lips or other oral mucosa - nerve damage due to positioning  - sore throat or hoarseness - Damage to heart, brain, nerves, lungs, other parts of body or loss of life  Patient voiced understanding and assent.)       Anesthesia Quick Evaluation

## 2023-08-17 NOTE — Anesthesia Postprocedure Evaluation (Signed)
Anesthesia Post Note  Patient: ZYMEER HEWITT  Procedure(s) Performed: ESOPHAGOGASTRODUODENOSCOPY (EGD) WITH PROPOFOL ESOPHAGEAL DILATION  Patient location during evaluation: PACU Anesthesia Type: General Level of consciousness: awake and alert Pain management: pain level controlled Vital Signs Assessment: post-procedure vital signs reviewed and stable Respiratory status: spontaneous breathing, nonlabored ventilation, respiratory function stable and patient connected to nasal cannula oxygen Cardiovascular status: blood pressure returned to baseline and stable Postop Assessment: no apparent nausea or vomiting Anesthetic complications: no  No notable events documented.   Last Vitals:  Vitals:   08/17/23 0929 08/17/23 0939  BP:  137/80  Pulse:  67  Resp: 19 20  Temp:    SpO2:  100%    Last Pain:  Vitals:   08/17/23 0939  TempSrc:   PainSc: 0-No pain                 Stephanie Coup

## 2023-08-17 NOTE — H&P (Signed)
Outpatient short stay form Pre-procedure 08/17/2023  Regis Bill, MD  Primary Physician: Luciana Axe, NP  Reason for visit:  Esophageal stenosis  History of present illness:    77 y/o gentleman with history of hypertension and DM II here for EGD for solid food dysphagia. Had dilation 3 months ago to 14 mm using savary. He denies blood thinners. No family history of GI malignancies.     Current Facility-Administered Medications:    0.9 %  sodium chloride infusion, , Intravenous, Continuous, Elizabeht Suto, Rossie Muskrat, MD, Last Rate: 20 mL/hr at 08/17/23 0821, 20 mL/hr at 08/17/23 0821  Medications Prior to Admission  Medication Sig Dispense Refill Last Dose   acetaminophen (TYLENOL) 500 MG tablet Take 1,000 mg by mouth every 8 (eight) hours as needed for mild pain or moderate pain.   08/16/2023   aspirin 81 MG tablet Take 81 mg by mouth daily.   Past Week   atenolol (TENORMIN) 25 MG tablet 25 mg.   08/16/2023   atorvastatin (LIPITOR) 10 MG tablet Take 10 mg by mouth daily.   08/16/2023   gabapentin (NEURONTIN) 600 MG tablet Take 600 mg by mouth 2 (two) times daily.   08/16/2023   glipiZIDE (GLUCOTROL) 10 MG tablet Take 10 mg by mouth 2 (two) times daily before a meal.   08/16/2023   lisinopril-hydrochlorothiazide (PRINZIDE,ZESTORETIC) 20-25 MG per tablet Take 1 tablet by mouth daily.   08/16/2023   metFORMIN (GLUCOPHAGE) 1000 MG tablet Take 1,000 mg by mouth 2 (two) times daily with a meal.   08/16/2023   pantoprazole (PROTONIX) 40 MG tablet Take 1 tablet (40 mg total) by mouth 2 (two) times daily. 60 tablet 0 08/16/2023   clopidogrel (PLAVIX) 75 MG tablet Take 75 mg by mouth daily. (Patient not taking: Reported on 08/17/2023)   Not Taking     Allergies  Allergen Reactions   Ivp Dye [Iodinated Contrast Media] Rash     Past Medical History:  Diagnosis Date   Anginal pain (HCC)    CHF (congestive heart failure) (HCC)    Coronary artery disease    Diabetes mellitus  without complication (HCC)    Hypertension    Myocardial infarction [facility])    Neuromuscular disorder (HCC)     Review of systems:  Otherwise negative.    Physical Exam  Gen: Alert, oriented. Appears stated age.  HEENT: PERRLA. Lungs: No respiratory distress CV: RRR Abd: soft, benign, no masses Ext: No edema    Planned procedures: Proceed with EGD. The patient understands the nature of the planned procedure, indications, risks, alternatives and potential complications including but not limited to bleeding, infection, perforation, damage to internal organs and possible oversedation/side effects from anesthesia. The patient agrees and gives consent to proceed.  Please refer to procedure notes for findings, recommendations and patient disposition/instructions.     Regis Bill, MD [facility]

## 2023-08-17 NOTE — Interval H&P Note (Signed)
History and Physical Interval Note:  08/17/2023 8:59 AM  Ernest Carpenter  has presented today for surgery, with the diagnosis of dysphagia.  The various methods of treatment have been discussed with the patient and family. After consideration of risks, benefits and other options for treatment, the patient has consented to  Procedure(s): ESOPHAGOGASTRODUODENOSCOPY (EGD) WITH PROPOFOL (N/A) as a surgical intervention.  The patient's history has been reviewed, patient examined, no change in status, stable for surgery.  I have reviewed the patient's chart and labs.  Questions were answered to the patient's satisfaction.     Regis Bill  Ok to proceed with EGD

## 2023-08-17 NOTE — Anesthesia Procedure Notes (Signed)
Procedure Name: General with mask airway Date/Time: 08/17/2023 9:04 AM  Performed by: Mohammed Kindle, CRNAPre-anesthesia Checklist: Patient identified, Emergency Drugs available, Suction available and Patient being monitored Patient Re-evaluated:Patient Re-evaluated prior to induction Oxygen Delivery Method: Simple face mask Induction Type: IV induction Placement Confirmation: positive ETCO2, CO2 detector and breath sounds checked- equal and bilateral Dental Injury: Teeth and Oropharynx as per pre-operative assessment

## 2024-01-12 ENCOUNTER — Ambulatory Visit: Admission: EM | Admit: 2024-01-12 | Discharge: 2024-01-12

## 2024-01-12 DIAGNOSIS — E1169 Type 2 diabetes mellitus with other specified complication: Secondary | ICD-10-CM

## 2024-01-12 DIAGNOSIS — L03113 Cellulitis of right upper limb: Secondary | ICD-10-CM

## 2024-01-12 DIAGNOSIS — S61401A Unspecified open wound of right hand, initial encounter: Secondary | ICD-10-CM

## 2024-01-12 DIAGNOSIS — I891 Lymphangitis: Secondary | ICD-10-CM | POA: Diagnosis not present

## 2024-01-12 NOTE — ED Triage Notes (Signed)
 Patient states that he injured his arm about a week and a half ago. Arm has since started swelling and finger is swollen. Patient has large sore on right middle finger. Patient is diabetic.

## 2024-01-12 NOTE — Discharge Instructions (Addendum)
-  You have a very serious infection of your finger and arm.  It is already spreading to your lymphatic system.  I have concerns that it could already be spread to your bone.  I am very concerned since you are diabetic.  The finger could be lost if you do not go to the ER immediately.  Do not delay as you have already waited quite a long time before being evaluated.  Please go to the ER now for IV antibiotics, possible admission, and consult with surgeon.  You have been advised to follow up immediately in the emergency department for concerning signs.symptoms. If you declined EMS transport, please have a family member take you directly to the ED at this time. Do not delay. Based on concerns about condition, if you do not follow up in th e ED, you may risk poor outcomes including worsening of condition, delayed treatment and potentially life threatening issues. If you have declined to go to the ED at this time, you should call your PCP immediately to set up a follow up appointment.  Go to ED for red flag symptoms, including; fevers you cannot reduce with Tylenol/Motrin, severe headaches, vision changes, numbness/weakness in part of the body, lethargy, confusion, intractable vomiting, severe dehydration, chest pain, breathing difficulty, severe persistent abdominal or pelvic pain, signs of severe infection (increased redness, swelling of an area), feeling faint or passing out, dizziness, etc. You should especially go to the ED for sudden acute worsening of condition if you do not elect to go at this time.

## 2024-01-12 NOTE — ED Provider Notes (Signed)
 [facility]    CSN: [id number] Arrival date & time: 01/12/24  1332      History   Chief Complaint Chief Complaint  Patient presents with   Hand Injury   Finger Injury    HPI Ernest Carpenter is a 78 y.o. male with history of CHF, CAD, type 2 diabetes, hypertension, previous MI.  Patient presents today for pain and swelling of the right middle finger for the past week and a half.  Patient says he dropped an 80 pound bag of something on his arm about a week and a half ago.  He reports increased redness, swelling and worsening pain especially over the past couple of days.  Now has an open wound on his right middle finger which is bleeding and draining pus.  States the whole arm feels hot.  Denies fever.  Patient is concerned since he is diabetic.  No known history of MRSA per patient.  HPI  Past Medical History:  Diagnosis Date   Anginal pain (HCC)    CHF (congestive heart failure) (HCC)    Coronary artery disease    Diabetes mellitus without complication (HCC)    Hypertension    Myocardial infarction [facility])    Neuromuscular disorder [facility])     Patient Active Problem List   Diagnosis Date Noted   Chest pain 06/07/2017    Past Surgical History:  Procedure Laterality Date   BALLOON DILATION  05/12/2023   Procedure: BALLOON DILATION;  Surgeon: Regis Bill, MD;  Location: [facility] ENDOSCOPY;  Service: Endoscopy;;   cardiac stents     x3   COLONOSCOPY N/A 02/13/2015   Procedure: COLONOSCOPY;  Surgeon: Christena Deem, MD;  Location: [facility] ENDOSCOPY;  Service: Endoscopy;  Laterality: N/A;   CORONARY ANGIOPLASTY     ESOPHAGEAL DILATION  08/17/2023   Procedure: ESOPHAGEAL DILATION;  Surgeon: Regis Bill, MD;  Location: [facility] ENDOSCOPY;  Service: Endoscopy;;   ESOPHAGOGASTRODUODENOSCOPY (EGD) WITH PROPOFOL  02/13/2015   Procedure: ESOPHAGOGASTRODUODENOSCOPY (EGD) WITH PROPOFOL;  Surgeon: Christena Deem, MD;  Location: [facility] ENDOSCOPY;  Service: Endoscopy;;    ESOPHAGOGASTRODUODENOSCOPY (EGD) WITH PROPOFOL N/A 05/12/2023   Procedure: ESOPHAGOGASTRODUODENOSCOPY (EGD) WITH PROPOFOL;  Surgeon: Regis Bill, MD;  Location: [facility] ENDOSCOPY;  Service: Endoscopy;  Laterality: N/A;   ESOPHAGOGASTRODUODENOSCOPY (EGD) WITH PROPOFOL N/A 08/17/2023   Procedure: ESOPHAGOGASTRODUODENOSCOPY (EGD) WITH PROPOFOL;  Surgeon: Regis Bill, MD;  Location: [facility] ENDOSCOPY;  Service: Endoscopy;  Laterality: N/A;   SAVORY DILATION  05/12/2023   Procedure: SAVORY DILATION;  Surgeon: Regis Bill, MD;  Location: [facility] ENDOSCOPY;  Service: Endoscopy;;   TOE AMPUTATION Right    TONSILLECTOMY     UPPER GI ENDOSCOPY         Home Medications    Prior to Admission medications   Medication Sig Start Date End Date Taking? Authorizing Provider  amLODipine (NORVASC) 10 MG tablet Take 10 mg by mouth daily.   Yes [provider]  aspirin 81 MG tablet Take 81 mg by mouth daily.   Yes [provider]  atenolol (TENORMIN) 25 MG tablet 25 mg.   Yes [provider]  atorvastatin (LIPITOR) 10 MG tablet Take 10 mg by mouth daily.   Yes [provider]  gabapentin (NEURONTIN) 600 MG tablet Take 600 mg by mouth 2 (two) times daily.   Yes [provider]  glipiZIDE (GLUCOTROL) 10 MG tablet Take 10 mg by mouth 2 (two) times daily before a meal.   Yes [provider]  JARDIANCE 25 MG TABS tablet Take 1 tablet by mouth daily. 12/03/23  Yes [provider]  lisinopril-hydrochlorothiazide (PRINZIDE,ZESTORETIC) 20-25 MG per tablet Take 1 tablet by mouth daily.   Yes [provider]  pantoprazole (PROTONIX) 40 MG tablet Take 1 tablet (40 mg total) by mouth 2 (two) times daily. 06/07/17  Yes Wieting, Laurent, MD  rosuvastatin (CRESTOR) 40 MG tablet Take 1 tablet by mouth at bedtime. 12/03/23  Yes [provider]  acetaminophen (TYLENOL) 500 MG tablet Take 1,000 mg by mouth every 8 (eight) hours as needed  for mild pain or moderate pain.    [provider]  clopidogrel (PLAVIX) 75 MG tablet Take 75 mg by mouth daily. Patient not taking: Reported on 08/17/2023    [provider]  metFORMIN (GLUCOPHAGE) 1000 MG tablet Take 1,000 mg by mouth 2 (two) times daily with a meal.    [provider]    Family History History reviewed. No pertinent family history.  Social History Social History   Tobacco Use   Smoking status: Former    Current packs/day: 0.00    Types: Cigarettes    Quit date: 10/15/2000    Years since quitting: 23.2   Smokeless tobacco: Never  Vaping Use   Vaping status: Never Used  Substance Use Topics   Alcohol use: No   Drug use: No     Allergies   Ivp dye [iodinated contrast media]   Review of Systems Review of Systems  Constitutional:  Negative for fatigue and fever.  Musculoskeletal:  Positive for arthralgias and joint swelling.  Skin:  Positive for color change and wound.  Neurological:  Negative for weakness and numbness.     Physical Exam Triage Vital Signs ED Triage Vitals  Encounter Vitals Group     BP      Systolic BP Percentile      Diastolic BP Percentile      Pulse      Resp      Temp      Temp src      SpO2      Weight      Height      Head Circumference      Peak Flow      Pain Score      Pain Loc      Pain Education      Exclude from Growth Chart    No data found.  Updated Vital Signs BP 131/74 (BP Location: Left Arm)   Pulse 64   Temp 97.7 F (36.5 C) (Oral)   Resp 15   SpO2 97%        Physical Exam Vitals and nursing note reviewed.  Constitutional:      General: He is not in acute distress.    Appearance: Normal appearance. He is well-developed. He is not ill-appearing.  HENT:     Head: Normocephalic and atraumatic.  Eyes:     General: No scleral icterus.    Conjunctiva/sclera: Conjunctivae normal.  Cardiovascular:     Rate and Rhythm: Normal rate.     Pulses: Normal pulses.   Pulmonary:     Effort: Pulmonary effort is normal. No respiratory distress.  Musculoskeletal:     Cervical back: Neck supple.     Comments: See image included in chart of patient's right arm and right middle finger.  He has significant erythema, swelling and tenderness of the entire right middle digit with open bleeding wound and pustular drainage.  Erythema and swelling spreading up the arm with red streaking from the forearm to up above the elbow.  Tenderness of forearm.  Skin:    General: Skin is warm and dry.     Capillary Refill: Capillary refill takes less than 2 seconds.  Neurological:     General: No focal deficit present.     Mental Status: He is alert. Mental status is at baseline.     Motor: No weakness.     Gait: Gait normal.  Psychiatric:        Mood and Affect: Mood normal.        Behavior: Behavior normal.           UC Treatments / Results  Labs (all labs ordered are listed, but only abnormal results are displayed) Labs Reviewed - No data to display  EKG   Radiology No results found.  Procedures Procedures (including critical care time)  Medications Ordered in UC Medications - No data to display  Initial Impression / Assessment and Plan / UC Course  I have reviewed the triage vital signs and the nursing notes.  Pertinent labs & imaging results that were available during my care of the patient were reviewed by me and considered in my medical decision making (see chart for details).   78 year old male with history of heart disease and type 2 diabetes presents for 1-1/2-week history of injury to right arm and hand after dropping an 80 pound bag of something on himself.  Patient has had worsening redness, pain and swelling with an open wound of the right middle finger and difficulty moving the right middle finger for the past couple days.  Denies fever.  Vitals are stable.  He is afebrile.  See images included in chart.  Patient has significant cellulitis  and lymphangitis.  Diabetic wound/ulceration of right middle finger with significant concern for osteomyelitis.  Advised patient of this very serious condition and encouraged him to seek immediate evaluation in the emergency department.  Likely will need labs, IV antibiotics, possible admission and consult with hand/orthopedic surgery and wound care.  Patient is understanding and agreeable and plans to go to the ED immediately.  He is leaving in stable condition.   Final Clinical Impressions(s) / UC Diagnoses   Final diagnoses:  Cellulitis of right arm  Lymphangitis  Open wound of right hand, foreign body presence unspecified, unspecified wound type, initial encounter  Type 2 diabetes mellitus with other specified complication, unspecified whether long term insulin use (HCC)     Discharge Instructions      -You have a very serious infection of your finger and arm.  It is already spreading to your lymphatic system.  I have concerns that it could already be spread to your bone.  I am very concerned since you are diabetic.  The finger could be lost if you do not go to the ER immediately.  Do not delay as you have already waited quite a long time before being evaluated.  Please go to the ER now for IV antibiotics, possible admission, and consult with surgeon.  You have been advised to follow up immediately in the emergency department for concerning signs.symptoms. If you declined EMS transport, please have a family member take you directly to the ED at this time. Do not delay. Based on concerns about condition, if you do not follow up in th e ED, you may risk poor outcomes including worsening of condition, delayed treatment and potentially life threatening issues. If you  have declined to go to the ED at this time, you should call your PCP immediately to set up a follow up appointment.  Go to ED for red flag symptoms, including; fevers you cannot reduce with Tylenol/Motrin, severe headaches, vision  changes, numbness/weakness in part of the body, lethargy, confusion, intractable vomiting, severe dehydration, chest pain, breathing difficulty, severe persistent abdominal or pelvic pain, signs of severe infection (increased redness, swelling of an area), feeling faint or passing out, dizziness, etc. You should especially go to the ED for sudden acute worsening of condition if you do not elect to go at this time.      ED Prescriptions   None    PDMP not reviewed this encounter.   Shirlee Latch, PA-C 01/12/24 1418

## 2024-01-12 NOTE — H&P (Signed)
 ------------------------------------------------------------------------------- Attestation signed by Cetrone, Emily Diane, MD at 01/13/24 1631 I saw and evaluated the patient, participating in the key portions of the service. I reviewed the resident's note. I agree with the resident's findings and plan. Ernest JONETTA Barrack, MD. Please see my full attestation from the 01/12/25 progress note  -------------------------------------------------------------------------------  Geriatrics ([facility]) History & Physical  Assessment & Plan:  Ernest Carpenter is a 78 y.o. male whose presentation is complicated by CAD s/p CABG x 3, hypertension, well-controlled type 2 diabetes not on insulin , MVP s/p prosthetic ring that presented to [facility] with right hand third finger osteomyelitis after traumatic incident.   Principal Problem:   Osteomyelitis of finger of right hand Active Problems:   Anemia due to stage 3b chronic kidney disease   Atherosclerotic heart disease of native coronary artery with unstable angina pectoris   Essential hypertension   Gastro-esophageal reflux disease without esophagitis   MVP (mitral valve prolapse)   Peripheral neuropathy   Type 2 diabetes mellitus with peripheral neuropathy   Presence of aortocoronary bypass graft   S/P CABG x 3    Active Problems  Right hand third digit osteomyelitis Right finger infection with swelling, redness, open wound at the distal tip and warmth spreading up right forearm after traumatic incident being stuck under concrete bag.  X-ray shows severe erosion of distal phalanges in right third finger as well as middle not as well-appreciated by me.  Diabetes complicates healing. Orthopedics to evaluate for surgery in the morning. - had received vancomycin , will add ceftriaxone to cover gram-negative rods, based on him being well-appearing do not think he needs Pseudomonas or anaerobic coverage - Consult orthopedics for surgical evaluation-ED had  discussed case with them and they will see in the morning, patient will either transfer to main for surgery or injury will be managed conservatively -Added right wrist x-ray to evaluate for concomitant injury based on trauma affecting entire right hand -N.p.o. midnight in case of surgery -2.5 to 5 mg oxycodone  as needed for pain in addition to Tylenol , scheduled daily MiraLAX - PT/OT for R hand injury/wound  - resume DVT ppx post op [ ]     Diabetes mellitus Diabetes managed with Jardiance, well-controlled. Complicates hand infection healing. -Recommended podiatrist referral for monitoring of ulcer on left foot after admission, he had previously followed with a podiatrist, him/daughter were agreeable to this plan -Continue home Jardiance  CKD 3 Suspect that he has around his baseline creatinine 1.6 on admission with EGFR around 40 -Reduced home gabapentin  800 mg 3 times daily for neuropathy to 400 mg 3 times daily as needed to avoid oversedation or accumulation given concomitant history of CKD 3 daily amount 900 mg total -Reviewed prior records from Care Everywhere   Goals of Care Says he has a living will on file at [facility]. Agrees to CPR and mechanical ventilation this admission.     Contains text generated by [employer]   **Please do not delete or move. Please fill out the section below as best you can.    Mentation CAM:   CAM: Negative 6CIT:   Not yet completed (please touch base with nursing) Delirium Order Set: Ordered (appropriate for any patient >65) Dementia: No. Behavioral Symptoms (baseline or now): No. If any behavioral symptoms, agitation or delirium consider using to the medabehavioral dot phrase in a significant event note.  Mobility Baseline functional status: Independent in ADLs Current functional status: Needs Assistance with ADLs PT/OT Ordered: Yes  Medications Reviewed home medications,  screening for potentially inappropriate medications: Yes Medications  recommended de-prescribing: high dose GPN   What Matters Geri Assessment complete: No, deemed unnecessary, seems to have good support from family based on daughter at bedside    Chronic Problems  HTN-continue home amlodipine HLD-continue home atorvastatin  ?  Heart failure-mentioned in the chart but no clear evidence of heart failure exacerbations.  Has a as needed medication of torsemide 5 mg and recent PCP note asked patient about this in the morning  The patient's presentation is complicated by the following clinically significant conditions requiring additional evaluation and treatment: - Chronic kidney disease POA requiring further investigation, treatment, or monitoring  - Age related debility POA requiring additional resources: DME, PT, or OT     Issues Impacting Complexity of Management: <redacted file path> -Intensive monitoring of drug toxicity from Vancomycin  with scheduled BMP and/or Vancomycin  levels -High risk of complications from pain and/or analgesia likely to result in delirium -The patient is at high risk from Hospital immobility in an elderly patient given baseline poor functional status with a high risk of causing delirium and further decline in function   Medical Decision Making: Reviewed records from the following unique sources PCP at [facility]. Independently interpreted hand x-ray notable for finger infection.  Wrist x-ray ordered to evaluate for infection.   Checklist: Diet: NPO and Regular Diet DVT PPx: Contraindicated 2/2 possible procedure upcoming Code Status: Full Code Dispo: Patient appropriate for Observation based on expectation of ongoing need for hospitalization less than two midnights and/or low intensity of services provided  Team Contact Information:  Primary Team: Geriatrics ([facility]) Primary Resident: Belvie Ables, MD Resident's Pager: [phone] (Geriatrics Senior Resident)  Chief Concern:  Osteomyelitis of finger of right hand   Subjective:   Ernest Carpenter is a 78 y.o. male with pertinent PMHx of hypertension, diabetes presenting with injury to his right hand third finger that is not healing and redness and warmth spreading up his right arm    History obtained by myself from patient with daughter Ellouise at bedside.    HPI: IKAIKA SHOWERS is a 78 year old male with diabetes who presents with a hand injury and possible infection. He is accompanied by his daughter, Ellouise.   Approximately two weeks ago, he sustained a hand injury while handling a bag of concrete mix, which caused his hand to twist and swell. Initially, the swelling increased and then decreased, but the hand 'busted open' about three days ago. He is unable to turn his hand fully with his right wrist and experiences some pain, although it is not severe. He has had chills and noticed a redness in his hand, which has been hot to the touch.   He has a history of diabetes, managed with oral medication, specifically Jardiance. His diabetes has been controlled since starting this medication. He has a history of a toe amputation of the right foot but currently denies any wounds on his feet.  Does not have diabetic shoes   No shortness of breath, chest pain, confusion, nausea, or trouble urinating. He reports having night sweats on one or two occasions but not recently. He denies any current bleeding except for the bleeding from his finger injury.   He lives with his wife and granddaughter and is generally independent in his activities.  According to his daughter he runs his household.  He has a history of smoking, having started at age 14 or [age] and quitting approximately twenty-five years ago around 2000. He denies current  alcohol or drug use.   Pertinent Surgical Hx CABG, toe amputation  Pertinent Family Hx None  Pertinent Social Hx  Above  Allergies Iodine  I reviewed the Medication List. The current list is Accurate Prior to Admission medications    Medication Dose, Route, Frequency  amlodipine (NORVASC) 10 MG tablet 10 mg, Daily (standard)  DULoxetine (CYMBALTA) 30 MG capsule 30 mg, Daily (standard)  JARDIANCE 25 mg tablet 25 mg, Daily (standard)  metoPROLOL tartrate (LOPRESSOR) 25 MG tablet 12.5 mg, 2 times a day (standard)  pantoprazole  (PROTONIX ) 40 MG tablet 40 mg, 2 times a day  rosuvastatin (CRESTOR) 40 MG tablet 40 mg, Nightly  acetaminophen  (TYLENOL ) 500 MG tablet 1,000 mg, Oral, Every 6 hours PRN  aspirin  (ECOTRIN) 81 MG tablet 81 mg, Oral, Daily (standard)  atorvastatin  (LIPITOR) 10 MG tablet 10 mg, Oral, Daily (standard), TAKE 10 MG BY MOUTH DAILY.  gabapentin  (NEURONTIN ) 800 MG tablet 800 mg, Oral, 3 times a day (standard)  lisinopril -hydroCHLOROthiazide  (PRINZIDE ,ZESTORETIC ) 20-25 mg per tablet 1 tablet, Oral, Daily (standard)    Librarian, academic: Mr. Farabee currently has decisional capacity for healthcare decision-making and is able to designate a surrogate healthcare decision maker. Mr. Villamor designated healthcare decision maker(s) is/are his wife  as denoted by hospital policy for patients without a known preference.  Objective:  Physical Exam: Temp:  [36.6 C (97.8 F)-37 C (98.6 F)] 37 C (98.6 F) Heart Rate:  [73-77] 77 Resp:  [19-20] 19 BP: (147-151)/(48-62) 151/48 SpO2:  [97 %-98 %] 97 %  Gen: NAD, converses  Eyes: Sclera anicteric, EOMI grossly normal  HENT: Atraumatic, normocephalic Neck: Trachea midline CHEST: Clear to auscultation bilaterally. CARDIOVASCULAR: Heart murmurs present. One around RSB/neck systolic and another closer to apex, sys, but doesn't radiate to axilla  EXTREMITIES: R hand neurovascularly intact (ulnar/radial, full ROM) w/ exception of limited ability to flex of his third digit with open wound near the tip of the finger, see media.  Lower extremities no edema. Pulses palpable bilaterally. Right hand/half to 3/4 of forearm erythema and warmth.  Somewhat  limited range of motion of right wrist Abdomen: Soft, NTND Neuro: Grossly symmetric, non-focal   Skin:  No rashes, lesions on clothed exam with exception of ulcer on the sole of his left foot that is nontender and does not appear to be actively infected such as redness, warmth or tenderness Psych: Alert, oriented

## 2024-01-12 NOTE — ED Notes (Signed)
 Patient is being discharged from the Urgent Care and sent to the Emergency Department via PV . Per Shirlee Latch, PA-C , patient is in need of higher level of care due to cellulitis of right arm.  . Patient is aware and verbalizes understanding of plan of care.  Vitals:   01/12/24 1357  BP: 131/74  Pulse: 64  Resp: 15  Temp: 97.7 F (36.5 C)  SpO2: 97%

## 2024-03-18 ENCOUNTER — Other Ambulatory Visit (INDEPENDENT_AMBULATORY_CARE_PROVIDER_SITE_OTHER): Payer: Self-pay | Admitting: Podiatry

## 2024-03-18 DIAGNOSIS — L97529 Non-pressure chronic ulcer of other part of left foot with unspecified severity: Secondary | ICD-10-CM

## 2024-03-23 ENCOUNTER — Ambulatory Visit (INDEPENDENT_AMBULATORY_CARE_PROVIDER_SITE_OTHER): Payer: Self-pay

## 2024-03-23 DIAGNOSIS — L97529 Non-pressure chronic ulcer of other part of left foot with unspecified severity: Secondary | ICD-10-CM

## 2024-04-04 ENCOUNTER — Inpatient Hospital Stay

## 2024-04-04 ENCOUNTER — Inpatient Hospital Stay
Admission: EM | Admit: 2024-04-04 | Discharge: 2024-04-10 | DRG: 240 | Disposition: A | Discharge status: Home Health | Source: Ambulatory Visit | Attending: Obstetrics and Gynecology | Admitting: Obstetrics and Gynecology

## 2024-04-04 ENCOUNTER — Encounter: Payer: Self-pay | Admitting: Intensive Care

## 2024-04-04 ENCOUNTER — Other Ambulatory Visit: Payer: Self-pay

## 2024-04-04 DIAGNOSIS — M86172 Other acute osteomyelitis, left ankle and foot: Secondary | ICD-10-CM | POA: Diagnosis present

## 2024-04-04 DIAGNOSIS — D75838 Other thrombocytosis: Secondary | ICD-10-CM | POA: Diagnosis present

## 2024-04-04 DIAGNOSIS — L03032 Cellulitis of left toe: Secondary | ICD-10-CM | POA: Insufficient documentation

## 2024-04-04 DIAGNOSIS — Z89421 Acquired absence of other right toe(s): Secondary | ICD-10-CM

## 2024-04-04 DIAGNOSIS — I4891 Unspecified atrial fibrillation: Secondary | ICD-10-CM | POA: Diagnosis present

## 2024-04-04 DIAGNOSIS — I2510 Atherosclerotic heart disease of native coronary artery without angina pectoris: Secondary | ICD-10-CM | POA: Diagnosis present

## 2024-04-04 DIAGNOSIS — N178 Other acute kidney failure: Secondary | ICD-10-CM | POA: Diagnosis not present

## 2024-04-04 DIAGNOSIS — D631 Anemia in chronic kidney disease: Secondary | ICD-10-CM | POA: Diagnosis present

## 2024-04-04 DIAGNOSIS — E1122 Type 2 diabetes mellitus with diabetic chronic kidney disease: Secondary | ICD-10-CM | POA: Diagnosis present

## 2024-04-04 DIAGNOSIS — E119 Type 2 diabetes mellitus without complications: Secondary | ICD-10-CM

## 2024-04-04 DIAGNOSIS — E1142 Type 2 diabetes mellitus with diabetic polyneuropathy: Secondary | ICD-10-CM | POA: Diagnosis present

## 2024-04-04 DIAGNOSIS — Z79899 Other long term (current) drug therapy: Secondary | ICD-10-CM

## 2024-04-04 DIAGNOSIS — Z87891 Personal history of nicotine dependence: Secondary | ICD-10-CM

## 2024-04-04 DIAGNOSIS — I70262 Atherosclerosis of native arteries of extremities with gangrene, left leg: Secondary | ICD-10-CM | POA: Diagnosis present

## 2024-04-04 DIAGNOSIS — Z91041 Radiographic dye allergy status: Secondary | ICD-10-CM | POA: Diagnosis not present

## 2024-04-04 DIAGNOSIS — Z955 Presence of coronary angioplasty implant and graft: Secondary | ICD-10-CM

## 2024-04-04 DIAGNOSIS — I96 Gangrene, not elsewhere classified: Secondary | ICD-10-CM | POA: Diagnosis present

## 2024-04-04 DIAGNOSIS — Z951 Presence of aortocoronary bypass graft: Secondary | ICD-10-CM

## 2024-04-04 DIAGNOSIS — I252 Old myocardial infarction: Secondary | ICD-10-CM

## 2024-04-04 DIAGNOSIS — E11621 Type 2 diabetes mellitus with foot ulcer: Secondary | ICD-10-CM | POA: Diagnosis present

## 2024-04-04 DIAGNOSIS — N1832 Chronic kidney disease, stage 3b: Secondary | ICD-10-CM | POA: Diagnosis present

## 2024-04-04 DIAGNOSIS — L03116 Cellulitis of left lower limb: Secondary | ICD-10-CM | POA: Diagnosis present

## 2024-04-04 DIAGNOSIS — E860 Dehydration: Secondary | ICD-10-CM | POA: Diagnosis present

## 2024-04-04 DIAGNOSIS — Z7902 Long term (current) use of antithrombotics/antiplatelets: Secondary | ICD-10-CM

## 2024-04-04 DIAGNOSIS — I70201 Unspecified atherosclerosis of native arteries of extremities, right leg: Secondary | ICD-10-CM | POA: Diagnosis present

## 2024-04-04 DIAGNOSIS — Z7982 Long term (current) use of aspirin: Secondary | ICD-10-CM | POA: Diagnosis not present

## 2024-04-04 DIAGNOSIS — I129 Hypertensive chronic kidney disease with stage 1 through stage 4 chronic kidney disease, or unspecified chronic kidney disease: Secondary | ICD-10-CM | POA: Diagnosis present

## 2024-04-04 DIAGNOSIS — N179 Acute kidney failure, unspecified: Secondary | ICD-10-CM | POA: Diagnosis present

## 2024-04-04 DIAGNOSIS — E872 Acidosis, unspecified: Secondary | ICD-10-CM | POA: Diagnosis present

## 2024-04-04 DIAGNOSIS — E1152 Type 2 diabetes mellitus with diabetic peripheral angiopathy with gangrene: Secondary | ICD-10-CM | POA: Diagnosis present

## 2024-04-04 DIAGNOSIS — Z7984 Long term (current) use of oral hypoglycemic drugs: Secondary | ICD-10-CM

## 2024-04-04 DIAGNOSIS — R630 Anorexia: Secondary | ICD-10-CM | POA: Diagnosis present

## 2024-04-04 DIAGNOSIS — L97529 Non-pressure chronic ulcer of other part of left foot with unspecified severity: Secondary | ICD-10-CM | POA: Diagnosis present

## 2024-04-04 DIAGNOSIS — E1169 Type 2 diabetes mellitus with other specified complication: Secondary | ICD-10-CM | POA: Diagnosis present

## 2024-04-04 DIAGNOSIS — I10 Essential (primary) hypertension: Secondary | ICD-10-CM | POA: Diagnosis present

## 2024-04-04 DIAGNOSIS — Z95828 Presence of other vascular implants and grafts: Secondary | ICD-10-CM | POA: Diagnosis not present

## 2024-04-04 DIAGNOSIS — Z9889 Other specified postprocedural states: Secondary | ICD-10-CM | POA: Diagnosis not present

## 2024-04-04 LAB — COMPREHENSIVE METABOLIC PANEL WITH GFR
ALT: 46 U/L — ABNORMAL HIGH (ref 0–44)
AST: 51 U/L — ABNORMAL HIGH (ref 15–41)
Albumin: 2.7 g/dL — ABNORMAL LOW (ref 3.5–5.0)
Alkaline Phosphatase: 116 U/L (ref 38–126)
Anion gap: 11 (ref 5–15)
BUN: 39 mg/dL — ABNORMAL HIGH (ref 8–23)
CO2: 21 mmol/L — ABNORMAL LOW (ref 22–32)
Calcium: 8.6 mg/dL — ABNORMAL LOW (ref 8.9–10.3)
Chloride: 104 mmol/L (ref 98–111)
Creatinine, Ser: 3.07 mg/dL — ABNORMAL HIGH (ref 0.61–1.24)
GFR, Estimated: 20 mL/min — ABNORMAL LOW (ref >60)
Glucose, Bld: 155 mg/dL — ABNORMAL HIGH (ref 70–99)
Potassium: 4.3 mmol/L (ref 3.5–5.1)
Sodium: 136 mmol/L (ref 135–145)
Total Bilirubin: 1.1 mg/dL (ref 0.0–1.2)
Total Protein: 7.4 g/dL (ref 6.5–8.1)

## 2024-04-04 LAB — HEMOGLOBIN A1C
Hgb A1c MFr Bld: 7 % — ABNORMAL HIGH (ref 4.8–5.6)
Mean Plasma Glucose: 154.2 mg/dL

## 2024-04-04 LAB — CBC WITH DIFFERENTIAL/PLATELET
Abs Immature Granulocytes: 0.06 10*3/uL (ref 0.00–0.07)
Basophils Absolute: 0.1 10*3/uL (ref 0.0–0.1)
Basophils Relative: 1 %
Eosinophils Absolute: 0.3 10*3/uL (ref 0.0–0.5)
Eosinophils Relative: 3 %
HCT: 37.6 % — ABNORMAL LOW (ref 39.0–52.0)
Hemoglobin: 12.3 g/dL — ABNORMAL LOW (ref 13.0–17.0)
Immature Granulocytes: 1 %
Lymphocytes Relative: 18 %
Lymphs Abs: 2 10*3/uL (ref 0.7–4.0)
MCH: 26.9 pg (ref 26.0–34.0)
MCHC: 32.7 g/dL (ref 30.0–36.0)
MCV: 82.1 fL (ref 80.0–100.0)
Monocytes Absolute: 1.2 10*3/uL — ABNORMAL HIGH (ref 0.1–1.0)
Monocytes Relative: 10 %
Neutro Abs: 7.7 10*3/uL (ref 1.7–7.7)
Neutrophils Relative %: 67 %
Platelets: 517 10*3/uL — ABNORMAL HIGH (ref 150–400)
RBC: 4.58 MIL/uL (ref 4.22–5.81)
RDW: 14.4 % (ref 11.5–15.5)
WBC: 11.3 10*3/uL — ABNORMAL HIGH (ref 4.0–10.5)
nRBC: 0 % (ref 0.0–0.2)

## 2024-04-04 LAB — GLUCOSE, CAPILLARY
Glucose-Capillary: 137 mg/dL — ABNORMAL HIGH (ref 70–99)
Glucose-Capillary: 123 mg/dL — ABNORMAL HIGH (ref 70–99)

## 2024-04-04 LAB — LACTIC ACID, PLASMA: Lactic Acid, Venous: 1 mmol/L (ref 0.5–1.9)

## 2024-04-04 MED ORDER — SODIUM CHLORIDE 0.9 % IV BOLUS
1000.0000 mL | Freq: Once | INTRAVENOUS | Status: AC
  Administered 2024-04-04: 1000 mL via INTRAVENOUS

## 2024-04-04 MED ORDER — METRONIDAZOLE 500 MG/100ML IV SOLN
500.0000 mg | Freq: Once | INTRAVENOUS | Status: AC
  Administered 2024-04-04: 500 mg via INTRAVENOUS
  Filled 2024-04-04: qty 100

## 2024-04-04 MED ORDER — PANTOPRAZOLE SODIUM 40 MG PO TBEC
40.0000 mg | DELAYED_RELEASE_TABLET | Freq: Two times a day (BID) | ORAL | Status: DC
  Administered 2024-04-04 – 2024-04-10 (×10): 40 mg via ORAL
  Filled 2024-04-04 (×11): qty 1

## 2024-04-04 MED ORDER — CEFAZOLIN SODIUM-DEXTROSE 2-4 GM/100ML-% IV SOLN: 2.0000 g | Freq: Two times a day (BID) | INTRAVENOUS | Status: DC

## 2024-04-04 MED ORDER — ONDANSETRON HCL 4 MG/2ML IJ SOLN: 4.0000 mg | Freq: Four times a day (QID) | INTRAMUSCULAR | Status: DC | PRN

## 2024-04-04 MED ORDER — SODIUM CHLORIDE 0.9 % IV SOLN: INTRAVENOUS | Status: AC

## 2024-04-04 MED ORDER — LINEZOLID 600 MG/300ML IV SOLN
600.0000 mg | Freq: Two times a day (BID) | INTRAVENOUS | Status: DC
  Administered 2024-04-04 – 2024-04-06 (×5): 600 mg via INTRAVENOUS
  Filled 2024-04-04 (×6): qty 300

## 2024-04-04 MED ORDER — ONDANSETRON HCL 4 MG PO TABS: 4.0000 mg | ORAL_TABLET | Freq: Four times a day (QID) | ORAL | Status: DC | PRN

## 2024-04-04 MED ORDER — SODIUM CHLORIDE 0.9 % IV SOLN
2.0000 g | INTRAVENOUS | Status: DC
  Administered 2024-04-05 – 2024-04-08 (×4): 2 g via INTRAVENOUS
  Filled 2024-04-04 (×5): qty 20

## 2024-04-04 MED ORDER — SODIUM CHLORIDE 0.9 % IV SOLN
2.0000 g | Freq: Once | INTRAVENOUS | Status: AC
  Administered 2024-04-04: 2 g via INTRAVENOUS
  Filled 2024-04-04: qty 12.5

## 2024-04-04 MED ORDER — ROSUVASTATIN CALCIUM 20 MG PO TABS
40.0000 mg | ORAL_TABLET | Freq: Every day | ORAL | Status: DC
  Administered 2024-04-04 – 2024-04-09 (×6): 40 mg via ORAL
  Filled 2024-04-04 (×2): qty 2
  Filled 2024-04-04: qty 4
  Filled 2024-04-04 (×3): qty 2

## 2024-04-04 MED ORDER — HEPARIN SODIUM (PORCINE) 5000 UNIT/ML IJ SOLN: 5000.0000 [IU] | Freq: Three times a day (TID) | INTRAMUSCULAR | Status: DC

## 2024-04-04 MED ORDER — ATENOLOL 25 MG PO TABS: 25.0000 mg | ORAL_TABLET | Freq: Every day | ORAL | Status: DC

## 2024-04-04 MED ORDER — HYDROCODONE-ACETAMINOPHEN 5-325 MG PO TABS
1.0000 | ORAL_TABLET | ORAL | Status: DC | PRN
  Administered 2024-04-05 (×2): 1 via ORAL
  Administered 2024-04-07: 2 via ORAL
  Administered 2024-04-07: 1 via ORAL
  Administered 2024-04-07 (×2): 2 via ORAL
  Administered 2024-04-08 – 2024-04-09 (×3): 1 via ORAL
  Administered 2024-04-10: 2 via ORAL
  Filled 2024-04-04 (×2): qty 2
  Filled 2024-04-04: qty 1
  Filled 2024-04-04 (×3): qty 2
  Filled 2024-04-04 (×5): qty 1

## 2024-04-04 MED ORDER — GABAPENTIN 400 MG PO CAPS
800.0000 mg | ORAL_CAPSULE | Freq: Three times a day (TID) | ORAL | Status: DC
  Administered 2024-04-04 – 2024-04-05 (×2): 800 mg via ORAL
  Filled 2024-04-04 (×2): qty 2

## 2024-04-04 MED ORDER — INSULIN ASPART 100 UNIT/ML IJ SOLN
0.0000 [IU] | Freq: Three times a day (TID) | INTRAMUSCULAR | Status: DC
  Administered 2024-04-04: 2 [IU] via SUBCUTANEOUS
  Administered 2024-04-06: 5 [IU] via SUBCUTANEOUS
  Administered 2024-04-06 – 2024-04-07 (×3): 3 [IU] via SUBCUTANEOUS
  Administered 2024-04-07: 2 [IU] via SUBCUTANEOUS
  Administered 2024-04-08: 5 [IU] via SUBCUTANEOUS
  Administered 2024-04-08: 2 [IU] via SUBCUTANEOUS
  Administered 2024-04-09: 3 [IU] via SUBCUTANEOUS
  Administered 2024-04-09: 2 [IU] via SUBCUTANEOUS
  Administered 2024-04-10: 3 [IU] via SUBCUTANEOUS
  Filled 2024-04-04 (×11): qty 1

## 2024-04-04 MED ORDER — GADOBUTROL 1 MMOL/ML IV SOLN
7.0000 mL | Freq: Once | INTRAVENOUS | Status: AC | PRN
  Administered 2024-04-04: 7 mL via INTRAVENOUS

## 2024-04-04 MED ORDER — AMLODIPINE BESYLATE 10 MG PO TABS
10.0000 mg | ORAL_TABLET | Freq: Every day | ORAL | Status: DC
  Administered 2024-04-05 – 2024-04-10 (×5): 10 mg via ORAL
  Filled 2024-04-04 (×3): qty 1
  Filled 2024-04-04: qty 2
  Filled 2024-04-04: qty 1

## 2024-04-04 MED ORDER — VANCOMYCIN HCL IN DEXTROSE 1-5 GM/200ML-% IV SOLN
1000.0000 mg | Freq: Once | INTRAVENOUS | Status: AC
  Administered 2024-04-04: 1000 mg via INTRAVENOUS
  Filled 2024-04-04: qty 200

## 2024-04-04 NOTE — Assessment & Plan Note (Signed)
 Hold Jardiance and metformin Place patient on sliding scale insulin  Maintain consistent carbohydrate diet

## 2024-04-04 NOTE — H&P (Addendum)
 History and Physical    Patient: Ernest Carpenter FMW:[id number] DOB: 25-Apr-1946 DOA: 04/04/2024 DOS: the patient was seen and examined on 04/04/2024 PCP: Steva Clotilda DEL, NP  Patient coming from: Home  Chief Complaint:  Chief Complaint  Patient presents with   Wound Infection   HPI: Ernest Carpenter is a 78 y.o. male with medical history significant for coronary artery disease status post CABG and stent angioplasty, diabetes mellitus, hypertension, history of atrial fibrillation, history of PAD who presents to the emergency room from the podiatrist's office for evaluation of gangrene involving the left fifth toe. Patient notes that he has had issues with his left toe which has progressively worsened over the last several weeks and now has a darkish discoloration and foul smell to it as well as drainage.  He notes that his oral intake has been very poor due to lack of appetite. He denies having any fever or chills, no chest pain, no shortness of breath, no headache, no blurred vision, no abdominal pain, no nausea, no vomiting, no changes in his bowel habits, no urinary symptoms, no blurred vision. Abnormal labs include a white count of 11.3, platelet count 517, BUN 39, creatinine 3.07 compared to baseline of 1.38 from 04/25. MRI of the left foot to rule out osteomyelitis is pending Patient received a dose of cefepime, vancomycin  and Flagyl in the ER and will be admitted to the hospital for further evaluation.     Review of Systems: As mentioned in the history of present illness. All other systems reviewed and are negative. Past Medical History:  Diagnosis Date   Anginal pain (HCC)    CHF (congestive heart failure) (HCC)    Coronary artery disease    Diabetes mellitus without complication (HCC)    Hypertension    Myocardial infarction (HCC)    Neuromuscular disorder (HCC)    Past Surgical History:  Procedure Laterality Date   BALLOON DILATION  05/12/2023   Procedure: BALLOON  DILATION;  Surgeon: Maryruth Ole DASEN, MD;  Location: [facility] ENDOSCOPY;  Service: Endoscopy;;   cardiac stents     x3   COLONOSCOPY N/A 02/13/2015   Procedure: COLONOSCOPY;  Surgeon: Gladis RAYMOND Mariner, MD;  Location: [facility] ENDOSCOPY;  Service: Endoscopy;  Laterality: N/A;   CORONARY ANGIOPLASTY     ESOPHAGEAL DILATION  08/17/2023   Procedure: ESOPHAGEAL DILATION;  Surgeon: Maryruth Ole DASEN, MD;  Location: [facility] ENDOSCOPY;  Service: Endoscopy;;   ESOPHAGOGASTRODUODENOSCOPY (EGD) WITH PROPOFOL   02/13/2015   Procedure: ESOPHAGOGASTRODUODENOSCOPY (EGD) WITH PROPOFOL ;  Surgeon: Gladis RAYMOND Mariner, MD;  Location: [facility] ENDOSCOPY;  Service: Endoscopy;;   ESOPHAGOGASTRODUODENOSCOPY (EGD) WITH PROPOFOL  N/A 05/12/2023   Procedure: ESOPHAGOGASTRODUODENOSCOPY (EGD) WITH PROPOFOL ;  Surgeon: Maryruth Ole DASEN, MD;  Location: [facility] ENDOSCOPY;  Service: Endoscopy;  Laterality: N/A;   ESOPHAGOGASTRODUODENOSCOPY (EGD) WITH PROPOFOL  N/A 08/17/2023   Procedure: ESOPHAGOGASTRODUODENOSCOPY (EGD) WITH PROPOFOL ;  Surgeon: Maryruth Ole DASEN, MD;  Location: [facility] ENDOSCOPY;  Service: Endoscopy;  Laterality: N/A;   SAVORY DILATION  05/12/2023   Procedure: SAVORY DILATION;  Surgeon: Maryruth Ole DASEN, MD;  Location: [facility] ENDOSCOPY;  Service: Endoscopy;;   TOE AMPUTATION Right    TONSILLECTOMY     UPPER GI ENDOSCOPY     Social History:  reports that he quit smoking about 23 years ago. His smoking use included cigarettes. He has never used smokeless tobacco. He reports that he does not drink alcohol and does not use drugs.  Allergies  Allergen Reactions   Ivp Dye [Iodinated Contrast Media] Rash  History reviewed. No pertinent family history.  Prior to Admission medications   Medication Sig Start Date End Date Taking? Authorizing Provider  acetaminophen  (TYLENOL ) 500 MG tablet Take 1,000 mg by mouth every 8 (eight) hours as needed for mild pain or moderate pain.    [provider]  amLODipine (NORVASC) 10  MG tablet Take 10 mg by mouth daily.    [provider]  aspirin  81 MG tablet Take 81 mg by mouth daily.    [provider]  atenolol  (TENORMIN ) 25 MG tablet 25 mg.    [provider]  atorvastatin  (LIPITOR) 10 MG tablet Take 10 mg by mouth daily.    [provider]  clopidogrel  (PLAVIX ) 75 MG tablet Take 75 mg by mouth daily. Patient not taking: Reported on 08/17/2023    [provider]  gabapentin  (NEURONTIN ) 600 MG tablet Take 600 mg by mouth 2 (two) times daily.    [provider]  glipiZIDE (GLUCOTROL) 10 MG tablet Take 10 mg by mouth 2 (two) times daily before a meal.    [provider]  JARDIANCE 25 MG TABS tablet Take 1 tablet by mouth daily. 12/03/23   [provider]  lisinopril -hydrochlorothiazide  (PRINZIDE ,ZESTORETIC ) 20-25 MG per tablet Take 1 tablet by mouth daily.    [provider]  metFORMIN (GLUCOPHAGE) 1000 MG tablet Take 1,000 mg by mouth 2 (two) times daily with a meal.    [provider]  pantoprazole  (PROTONIX ) 40 MG tablet Take 1 tablet (40 mg total) by mouth 2 (two) times daily. 06/07/17   Josette Ade, MD  rosuvastatin (CRESTOR) 40 MG tablet Take 1 tablet by mouth at bedtime. 12/03/23   [provider]    Physical Exam: Vitals:   04/04/24 1430 04/04/24 1500 04/04/24 1535 04/04/24 1550  BP: (!) 111/54 (!) 137/104 113/62 125/64  Pulse: (!) 55 (!) 57 (!) 56 (!) 107  Resp: 16 15 19 15   Temp:    98.3 F (36.8 C)  TempSrc:    (P) Oral  SpO2: 100% 100% 99% 100%  Weight:      Height:       Physical Exam Vitals and nursing note reviewed.  Constitutional:      Appearance: Normal appearance.  HENT:     Head: Normocephalic and atraumatic.     Nose: Nose normal.     Mouth/Throat:     Mouth: Mucous membranes are dry.   Eyes:     Comments: Conjunctival pallor   Cardiovascular:     Rate and Rhythm: Normal rate and regular rhythm.  Pulmonary:     Effort: Pulmonary  effort is normal.     Breath sounds: Normal breath sounds.  Abdominal:     General: Abdomen is flat.     Palpations: Abdomen is soft.   Musculoskeletal:     Cervical back: Normal range of motion and neck supple.     Comments: Left fifth toe gangrene with ulceration involving the lateral portion of the left foot.   Skin:    General: Skin is warm and dry.   Neurological:     General: No focal deficit present.     Mental Status: He is alert and oriented to person, place, and time.   Psychiatric:        Mood and Affect: Mood normal.        Behavior: Behavior normal.     Data Reviewed: Relevant notes from primary care and specialist visits, past discharge summaries as available in  EHR, including Care Everywhere. Prior diagnostic testing as pertinent to current admission diagnoses Updated medications and problem lists for reconciliation ED course, including vitals, labs, imaging, treatment and response to treatment Triage notes, nursing and pharmacy notes and ED provider's notes Notable results as noted in HPI Labs reviewed.  Lactic acid 1.0, sodium 136, potassium 4.3, chloride 104, bicarb 21, glucose 155, BUN 39, creatinine 3.07 above baseline of 1.38, calcium  8.6, total protein 7.4, albumin 2.7, AST 51, ALT 46, alkaline phosphatase 116, white count 11.3, hemoglobin 12.3, hematocrit 37.6, platelet count 517  Labs reviewed  Assessment and Plan: * Gangrene of toe of left foot (HCC) Patient presents for evaluation of left fifth toe gangrene and foul-smelling ulceration involving the lateral portion of the left foot. 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 Hold lisinopril  and hydrochlorothiazide  Aggressive IV fluid hydration Repeat renal parameters in a.m.  CAD (coronary artery disease) Hold aspirin  and Plavix  for planned procedure Continue atenolol  with holding parameters and statins   Essential hypertension Continue atenolol  and amlodipine  Diabetes mellitus without complication (HCC) Hold Jardiance and metformin Place patient on sliding scale insulin  Maintain consistent carbohydrate diet      Advance Care Planning:   Code Status: Full Code   Consults: Podiatry and vascular surgery  Family Communication: Plan of care was discussed with patient's wife, Ernest Carpenter over the phone.  All questions and concerns have been addressed. She verbalizes understanding and agrees with the plan  Severity of Illness: The appropriate patient status for this patient is INPATIENT. Inpatient status is judged to be reasonable and necessary in order to provide the required intensity of service to ensure the patient's safety. The patient's presenting symptoms, physical exam findings, and initial radiographic and laboratory data in the context of their chronic comorbidities is felt to place them at high risk for further clinical deterioration. Furthermore, it is not anticipated that the patient will be medically stable for discharge from the hospital within 2 midnights of admission.   * I certify that at the point of admission it is my clinical judgment that the patient will require inpatient hospital care spanning beyond 2 midnights from the point of admission due to high intensity of service, high risk for further deterioration and high frequency of surveillance  required.*  Author: Aimee Somerset, MD 04/04/2024 4:23 PM  For on call review [email or web address].

## 2024-04-04 NOTE — Plan of Care (Signed)
  Problem: Skin Integrity: Goal: Risk for impaired skin integrity will decrease Outcome: Progressing   Problem: Clinical Measurements: Goal: Ability to maintain clinical measurements within normal limits will improve Outcome: Progressing

## 2024-04-04 NOTE — ED Triage Notes (Signed)
 Patient sent by Dr. Ashley for possible gangrene on bottom of left foot/toe  Patient reports Dr. Ashley was concerned about poor blood flow to left foot toes   History neuropathy

## 2024-04-04 NOTE — Consult Note (Signed)
 Hospital Consult    Reason for Consult:  Left Lower Extremity Gangrene to foot and 5th toe  Requesting Physician:  Dr Delayne Solian  MRN #:  [id number]  History of Present Illness: This is a 78 y.o. male with history of CAD, HTN, DM, CHF presenting to the emergency department for evaluation of gangrene of the left toe. Patient was seen in podiatry clinic today. There, there was concerns for gangrene of his left fifth left toe patient was sent to the ER for further evaluation. Vascular Surgery was consulted to evaluate.   Past Medical History:  Diagnosis Date   Anginal pain (HCC)    CHF (congestive heart failure) (HCC)    Coronary artery disease    Diabetes mellitus without complication (HCC)    Hypertension    Myocardial infarction (HCC)    Neuromuscular disorder (HCC)     Past Surgical History:  Procedure Laterality Date   BALLOON DILATION  05/12/2023   Procedure: BALLOON DILATION;  Surgeon: Maryruth Ole DASEN, MD;  Location: [facility] ENDOSCOPY;  Service: Endoscopy;;   cardiac stents     x3   COLONOSCOPY N/A 02/13/2015   Procedure: COLONOSCOPY;  Surgeon: Gladis RAYMOND Mariner, MD;  Location: [facility] ENDOSCOPY;  Service: Endoscopy;  Laterality: N/A;   CORONARY ANGIOPLASTY     ESOPHAGEAL DILATION  08/17/2023   Procedure: ESOPHAGEAL DILATION;  Surgeon: Maryruth Ole DASEN, MD;  Location: [facility] ENDOSCOPY;  Service: Endoscopy;;   ESOPHAGOGASTRODUODENOSCOPY (EGD) WITH PROPOFOL   02/13/2015   Procedure: ESOPHAGOGASTRODUODENOSCOPY (EGD) WITH PROPOFOL ;  Surgeon: Gladis RAYMOND Mariner, MD;  Location: [facility] ENDOSCOPY;  Service: Endoscopy;;   ESOPHAGOGASTRODUODENOSCOPY (EGD) WITH PROPOFOL  N/A 05/12/2023   Procedure: ESOPHAGOGASTRODUODENOSCOPY (EGD) WITH PROPOFOL ;  Surgeon: Maryruth Ole DASEN, MD;  Location: [facility] ENDOSCOPY;  Service: Endoscopy;  Laterality: N/A;   ESOPHAGOGASTRODUODENOSCOPY (EGD) WITH PROPOFOL  N/A 08/17/2023   Procedure: ESOPHAGOGASTRODUODENOSCOPY (EGD) WITH PROPOFOL ;  Surgeon: Maryruth Ole DASEN, MD;  Location: [facility] ENDOSCOPY;  Service: Endoscopy;  Laterality: N/A;   SAVORY DILATION  05/12/2023   Procedure: SAVORY DILATION;  Surgeon: Maryruth Ole DASEN, MD;  Location: [facility] ENDOSCOPY;  Service: Endoscopy;;   TOE AMPUTATION Right    TONSILLECTOMY     UPPER GI ENDOSCOPY      Allergies  Allergen Reactions   Ivp Dye [Iodinated Contrast Media] Rash    Prior to Admission medications   Medication Sig Start Date End Date Taking? Authorizing Provider  acetaminophen  (TYLENOL ) 500 MG tablet Take 1,000 mg by mouth every 8 (eight) hours as needed for mild pain or moderate pain.    [provider]  amLODipine (NORVASC) 10 MG tablet Take 10 mg by mouth daily.    [provider]  aspirin  81 MG tablet Take 81 mg by mouth daily.    [provider]  atenolol  (TENORMIN ) 25 MG tablet 25 mg.    [provider]  atorvastatin  (LIPITOR) 10 MG tablet Take 10 mg by mouth daily.    [provider]  clopidogrel  (PLAVIX ) 75 MG tablet Take 75 mg by mouth daily. Patient not taking: Reported on 08/17/2023    [provider]  gabapentin  (NEURONTIN ) 600 MG tablet Take 600 mg by mouth 2 (two) times daily.    [provider]  glipiZIDE (GLUCOTROL) 10 MG tablet Take 10 mg by mouth 2 (two) times daily before a meal.    [provider]  JARDIANCE 25 MG TABS tablet Take 1 tablet by mouth daily. 12/03/23   [provider]  lisinopril -hydrochlorothiazide  (PRINZIDE ,ZESTORETIC ) 20-25 MG per  tablet Take 1 tablet by mouth daily.    [provider]  metFORMIN (GLUCOPHAGE) 1000 MG tablet Take 1,000 mg by mouth 2 (two) times daily with a meal.    [provider]  pantoprazole  (PROTONIX ) 40 MG tablet Take 1 tablet (40 mg total) by mouth 2 (two) times daily. 06/07/17   Josette Ade, MD  rosuvastatin (CRESTOR) 40 MG tablet Take 1 tablet by mouth at bedtime. 12/03/23   [provider]    Social History    Socioeconomic History   Marital status: Married    Spouse name: Not on file   Number of children: Not on file   Years of education: Not on file   Highest education level: Not on file  Occupational History   Not on file  Tobacco Use   Smoking status: Former    Current packs/day: 0.00    Types: Cigarettes    Quit date: 10/15/2000    Years since quitting: 23.4   Smokeless tobacco: Never  Vaping Use   Vaping status: Never Used  Substance and Sexual Activity   Alcohol use: No   Drug use: No   Sexual activity: Never  Other Topics Concern   Not on file  Social History Narrative   Not on file   Social Drivers of Health   Financial Resource Strain: Low Risk  (02/24/2024)   Received from [facility]   Overall Financial Resource Strain (CARDIA)    Difficulty of Paying Living Expenses: Not very hard  Food Insecurity: Food Insecurity Present (02/24/2024)   Received [facility]   Hunger Vital Sign    Within the past 12 months, you worried that your food would run out before you got the money to buy more.: Sometimes true    Within the past 12 months, the food you bought just didn't last and you didn't have money to get more.: Never true  Transportation Needs: No Transportation Needs (02/24/2024)   Received from [facility] - Transportation    In the past 12 months, has lack of transportation kept you from medical appointments or from getting medications?: No    Lack of Transportation (Non-Medical): No  Physical Activity: Insufficiently Active (10/21/2017)   Received from [facility]   Exercise Vital Sign    Days of Exercise per Week: 4 days    Minutes of Exercise per Session: 30 min  Stress: No Stress Concern Present (10/21/2017)   Received from [facility] - Occupational Stress Questionnaire    Feeling of Stress : Only a little  Social  Connections: Moderately Integrated (10/21/2017)   Received from [facility]   Social Connection and Isolation Panel    Frequency of Communication with Friends and Family: More than three times a week    Frequency of Social Gatherings with Friends and Family: More than three times a week    Attends Religious Services: More than 4 times per year    Active Member of [employer] or Organizations: No    Attends Banker Meetings: Never    Marital Status: Married  Catering manager Violence: Not on file     History reviewed. No pertinent family history.  ROS: Otherwise negative unless mentioned in HPI  Physical Examination  Vitals:   04/04/24 1500 04/04/24 1535  BP: (!) 137/104 113/62  Pulse: (!) 57 (!) 56  Resp: 15 19  Temp:    SpO2: 100% 99%   Body mass index is 20.92 kg/m.  General:  WDWN in NAD Gait: Not observed HENT: WNL, normocephalic Pulmonary: normal non-labored breathing, without Rales, rhonchi,  wheezing Cardiac: regular, without  Murmurs, rubs or gallops; without carotid bruits Abdomen: Positive bowel sounds throughout, soft, NT/ND, no masses Skin: without rashes Vascular Exam/Pulses: Left Lower extremity warm to touch. Doppler DP pulse only. No PT pulse found. Right lower extremity with palpable pulses.  Extremities: with ischemic changes, with Gangrene , with cellulitis; without open wounds;  Musculoskeletal: no muscle wasting or atrophy  Neurologic: A&O X 3;  No focal weakness or paresthesias are detected; speech is fluent/normal Psychiatric:  The pt has Normal affect. Lymph:  Unremarkable  CBC    Component Value Date/Time   WBC 11.3 (H) 04/04/2024 1343   RBC 4.58 04/04/2024 1343   HGB 12.3 (L) 04/04/2024 1343   HGB 12.4 (L) 10/09/2013 0553   HCT 37.6 (L) 04/04/2024 1343   HCT 37.2 (L) 10/09/2013 0553   PLT 517 (H) 04/04/2024 1343   PLT 248 10/09/2013 0553   MCV 82.1 04/04/2024 1343   MCV 83 10/09/2013 0553   MCH 26.9 04/04/2024  1343   MCHC 32.7 04/04/2024 1343   RDW 14.4 04/04/2024 1343   RDW 15.4 (H) 10/09/2013 0553   LYMPHSABS 2.0 04/04/2024 1343   LYMPHSABS 3.2 10/09/2013 0553   MONOABS 1.2 (H) 04/04/2024 1343   MONOABS 1.1 (H) 10/09/2013 0553   EOSABS 0.3 04/04/2024 1343   EOSABS 0.3 10/09/2013 0553   BASOSABS 0.1 04/04/2024 1343   BASOSABS 0.1 10/09/2013 0553    BMET    Component Value Date/Time   NA 136 04/04/2024 1343   NA 138 10/09/2013 0553   K 4.3 04/04/2024 1343   K 3.9 10/09/2013 0553   CL 104 04/04/2024 1343   CL 103 10/09/2013 0553   CO2 21 (L) 04/04/2024 1343   CO2 30 10/09/2013 0553   GLUCOSE 155 (H) 04/04/2024 1343   GLUCOSE 137 (H) 10/09/2013 0553   BUN 39 (H) 04/04/2024 1343   BUN 23 (H) 10/09/2013 0553   CREATININE 3.07 (H) 04/04/2024 1343   CREATININE 1.18 10/09/2013 0553   CALCIUM  8.6 (L) 04/04/2024 1343   CALCIUM  8.9 10/09/2013 0553   GFRNONAA 20 (L) 04/04/2024 1343   GFRNONAA >60 10/09/2013 0553   GFRAA >60 06/07/2017 0557   GFRAA >60 10/09/2013 0553    COAGS: Lab Results  Component Value Date   INR 1.1 10/07/2013     Non-Invasive Vascular Imaging:   None ordered. Patient Had Normal LLE U/S with ABI's on 03/23/24  MRI of the left foot Ordered but not completed at this time.   Statin:  Yes.   Beta Blocker:  Yes.   Aspirin :  Yes.   ACEI:  Yes.   ARB:  No. CCB use:  Yes Other antiplatelets/anticoagulants:  No.   ASSESSMENT/PLAN: This is a 78 y.o. male who presents to [facility] emergency department after being seen by Dr. Ashley in office today.  Patient is noted to have a left foot/fifth toe gangrene with localized cellulitis.  Vascular surgery was consulted to evaluate.  Vascular surgery plans on taking the patient to the vascular lab tomorrow on 04/05/2024 for left lower extremity angiogram with possible intervention.  I discussed in detail at the bedside with the patient this afternoon the procedure, benefits, risk, and complications.  Patient verbalizes  understanding and wishes to proceed.  I answered all his questions today.  Patient will be made n.p.o. after midnight for procedure tomorrow.  Patient does currently take aspirin  81 mg daily.  He states he last took it today.  I also had a detailed discussion today about the patient possibly needing temporary hemodialysis after the procedure due to the use of dye and his current kidney status.  Patient's current BUN is 39, creatinine 3.07 and a GFR of 20.  Patient verbalizes understanding and wishes to proceed with the angiogram tomorrow.   -I discussed the case in detail with Dr. Cordella Shawl MD and he agrees with the plan.   Gwendlyn JONELLE [facility]ank [facility] 04/04/2024 3:38 PM

## 2024-04-04 NOTE — H&P (View-Only) (Signed)
 Hospital Consult    Reason for Consult:  Left Lower Extremity Gangrene to foot and 5th toe  Requesting Physician:  Dr Delayne Solian  MRN #:  [id number]  History of Present Illness: This is a 78 y.o. male with history of CAD, HTN, DM, CHF presenting to the emergency department for evaluation of gangrene of the left toe. Patient was seen in podiatry clinic today. There, there was concerns for gangrene of his left fifth left toe patient was sent to the ER for further evaluation. Vascular Surgery was consulted to evaluate.   Past Medical History:  Diagnosis Date   Anginal pain (HCC)    CHF (congestive heart failure) (HCC)    Coronary artery disease    Diabetes mellitus without complication (HCC)    Hypertension    Myocardial infarction (HCC)    Neuromuscular disorder (HCC)     Past Surgical History:  Procedure Laterality Date   BALLOON DILATION  05/12/2023   Procedure: BALLOON DILATION;  Surgeon: Maryruth Ole DASEN, MD;  Location: [facility] ENDOSCOPY;  Service: Endoscopy;;   cardiac stents     x3   COLONOSCOPY N/A 02/13/2015   Procedure: COLONOSCOPY;  Surgeon: Gladis RAYMOND Mariner, MD;  Location: [facility] ENDOSCOPY;  Service: Endoscopy;  Laterality: N/A;   CORONARY ANGIOPLASTY     ESOPHAGEAL DILATION  08/17/2023   Procedure: ESOPHAGEAL DILATION;  Surgeon: Maryruth Ole DASEN, MD;  Location: [facility] ENDOSCOPY;  Service: Endoscopy;;   ESOPHAGOGASTRODUODENOSCOPY (EGD) WITH PROPOFOL   02/13/2015   Procedure: ESOPHAGOGASTRODUODENOSCOPY (EGD) WITH PROPOFOL ;  Surgeon: Gladis RAYMOND Mariner, MD;  Location: [facility] ENDOSCOPY;  Service: Endoscopy;;   ESOPHAGOGASTRODUODENOSCOPY (EGD) WITH PROPOFOL  N/A 05/12/2023   Procedure: ESOPHAGOGASTRODUODENOSCOPY (EGD) WITH PROPOFOL ;  Surgeon: Maryruth Ole DASEN, MD;  Location: [facility] ENDOSCOPY;  Service: Endoscopy;  Laterality: N/A;   ESOPHAGOGASTRODUODENOSCOPY (EGD) WITH PROPOFOL  N/A 08/17/2023   Procedure: ESOPHAGOGASTRODUODENOSCOPY (EGD) WITH PROPOFOL ;  Surgeon: Maryruth Ole DASEN, MD;  Location: [facility] ENDOSCOPY;  Service: Endoscopy;  Laterality: N/A;   SAVORY DILATION  05/12/2023   Procedure: SAVORY DILATION;  Surgeon: Maryruth Ole DASEN, MD;  Location: [facility] ENDOSCOPY;  Service: Endoscopy;;   TOE AMPUTATION Right    TONSILLECTOMY     UPPER GI ENDOSCOPY      Allergies  Allergen Reactions   Ivp Dye [Iodinated Contrast Media] Rash    Prior to Admission medications   Medication Sig Start Date End Date Taking? Authorizing Provider  acetaminophen  (TYLENOL ) 500 MG tablet Take 1,000 mg by mouth every 8 (eight) hours as needed for mild pain or moderate pain.    [provider]  amLODipine (NORVASC) 10 MG tablet Take 10 mg by mouth daily.    [provider]  aspirin  81 MG tablet Take 81 mg by mouth daily.    [provider]  atenolol  (TENORMIN ) 25 MG tablet 25 mg.    [provider]  atorvastatin  (LIPITOR) 10 MG tablet Take 10 mg by mouth daily.    [provider]  clopidogrel  (PLAVIX ) 75 MG tablet Take 75 mg by mouth daily. Patient not taking: Reported on 08/17/2023    [provider]  gabapentin  (NEURONTIN ) 600 MG tablet Take 600 mg by mouth 2 (two) times daily.    [provider]  glipiZIDE (GLUCOTROL) 10 MG tablet Take 10 mg by mouth 2 (two) times daily before a meal.    [provider]  JARDIANCE 25 MG TABS tablet Take 1 tablet by mouth daily. 12/03/23   [provider]  lisinopril -hydrochlorothiazide  (PRINZIDE ,ZESTORETIC ) 20-25 MG per  tablet Take 1 tablet by mouth daily.    [provider]  metFORMIN (GLUCOPHAGE) 1000 MG tablet Take 1,000 mg by mouth 2 (two) times daily with a meal.    [provider]  pantoprazole  (PROTONIX ) 40 MG tablet Take 1 tablet (40 mg total) by mouth 2 (two) times daily. 06/07/17   Josette Ade, MD  rosuvastatin (CRESTOR) 40 MG tablet Take 1 tablet by mouth at bedtime. 12/03/23   [provider]    Social History    Socioeconomic History   Marital status: Married    Spouse name: Not on file   Number of children: Not on file   Years of education: Not on file   Highest education level: Not on file  Occupational History   Not on file  Tobacco Use   Smoking status: Former    Current packs/day: 0.00    Types: Cigarettes    Quit date: 10/15/2000    Years since quitting: 23.4   Smokeless tobacco: Never  Vaping Use   Vaping status: Never Used  Substance and Sexual Activity   Alcohol use: No   Drug use: No   Sexual activity: Never  Other Topics Concern   Not on file  Social History Narrative   Not on file   Social Drivers of Health   Financial Resource Strain: Low Risk  (02/24/2024)   Received from [facility]   Overall Financial Resource Strain (CARDIA)    Difficulty of Paying Living Expenses: Not very hard  Food Insecurity: Food Insecurity Present (02/24/2024)   Received from [facility]   Hunger Vital Sign    Within the past 12 months, you worried that your food would run out before you got the money to buy more.: Sometimes true    Within the past 12 months, the food you bought just didn't last and you didn't have money to get more.: Never true  Transportation Needs: No Transportation Needs (02/24/2024)   Received from [facility] - Transportation    In the past 12 months, has lack of transportation kept you from medical appointments or from getting medications?: No    Lack of Transportation (Non-Medical): No  Physical Activity: Insufficiently Active (10/21/2017)   Received from [facility]   Exercise Vital Sign    Days of Exercise per Week: 4 days    Minutes of Exercise per Session: 30 min  Stress: No Stress Concern Present (10/21/2017)   Received from [facility] - Occupational Stress Questionnaire    Feeling of Stress : Only a little  Social  Connections: Moderately Integrated (10/21/2017)   Received from [facility]   Social Connection and Isolation Panel    Frequency of Communication with Friends and Family: More than three times a week    Frequency of Social Gatherings with Friends and Family: More than three times a week    Attends Religious Services: More than 4 times per year    Active Member of [employer] or Organizations: No    Attends Banker Meetings: Never    Marital Status: Married  Catering manager Violence: Not on file     History reviewed. No pertinent family history.  ROS: Otherwise negative unless mentioned in HPI  Physical Examination  Vitals:   04/04/24 1500 04/04/24 1535  BP: (!) 137/104 113/62  Pulse: (!) 57 (!) 56  Resp: 15 19  Temp:    SpO2: 100% 99%   Body mass index is 20.92 kg/m.  General:  WDWN in NAD Gait: Not observed HENT: WNL, normocephalic Pulmonary: normal non-labored breathing, without Rales, rhonchi,  wheezing Cardiac: regular, without  Murmurs, rubs or gallops; without carotid bruits Abdomen: Positive bowel sounds throughout, soft, NT/ND, no masses Skin: without rashes Vascular Exam/Pulses: Left Lower extremity warm to touch. Doppler DP pulse only. No PT pulse found. Right lower extremity with palpable pulses.  Extremities: with ischemic changes, with Gangrene , with cellulitis; without open wounds;  Musculoskeletal: no muscle wasting or atrophy  Neurologic: A&O X 3;  No focal weakness or paresthesias are detected; speech is fluent/normal Psychiatric:  The pt has Normal affect. Lymph:  Unremarkable  CBC    Component Value Date/Time   WBC 11.3 (H) 04/04/2024 1343   RBC 4.58 04/04/2024 1343   HGB 12.3 (L) 04/04/2024 1343   HGB 12.4 (L) 10/09/2013 0553   HCT 37.6 (L) 04/04/2024 1343   HCT 37.2 (L) 10/09/2013 0553   PLT 517 (H) 04/04/2024 1343   PLT 248 10/09/2013 0553   MCV 82.1 04/04/2024 1343   MCV 83 10/09/2013 0553   MCH 26.9 04/04/2024  1343   MCHC 32.7 04/04/2024 1343   RDW 14.4 04/04/2024 1343   RDW 15.4 (H) 10/09/2013 0553   LYMPHSABS 2.0 04/04/2024 1343   LYMPHSABS 3.2 10/09/2013 0553   MONOABS 1.2 (H) 04/04/2024 1343   MONOABS 1.1 (H) 10/09/2013 0553   EOSABS 0.3 04/04/2024 1343   EOSABS 0.3 10/09/2013 0553   BASOSABS 0.1 04/04/2024 1343   BASOSABS 0.1 10/09/2013 0553    BMET    Component Value Date/Time   NA 136 04/04/2024 1343   NA 138 10/09/2013 0553   K 4.3 04/04/2024 1343   K 3.9 10/09/2013 0553   CL 104 04/04/2024 1343   CL 103 10/09/2013 0553   CO2 21 (L) 04/04/2024 1343   CO2 30 10/09/2013 0553   GLUCOSE 155 (H) 04/04/2024 1343   GLUCOSE 137 (H) 10/09/2013 0553   BUN 39 (H) 04/04/2024 1343   BUN 23 (H) 10/09/2013 0553   CREATININE 3.07 (H) 04/04/2024 1343   CREATININE 1.18 10/09/2013 0553   CALCIUM  8.6 (L) 04/04/2024 1343   CALCIUM  8.9 10/09/2013 0553   GFRNONAA 20 (L) 04/04/2024 1343   GFRNONAA >60 10/09/2013 0553   GFRAA >60 06/07/2017 0557   GFRAA >60 10/09/2013 0553    COAGS: Lab Results  Component Value Date   INR 1.1 10/07/2013     Non-Invasive Vascular Imaging:   None ordered. Patient Had Normal LLE U/S with ABI's on 03/23/24  MRI of the left foot Ordered but not completed at this time.   Statin:  Yes.   Beta Blocker:  Yes.   Aspirin :  Yes.   ACEI:  Yes.   ARB:  No. CCB use:  Yes Other antiplatelets/anticoagulants:  No.   ASSESSMENT/PLAN: This is a 78 y.o. male who presents to [facility] emergency department after being seen by Dr. Ashley in office today.  Patient is noted to have a left foot/fifth toe gangrene with localized cellulitis.  Vascular surgery was consulted to evaluate.  Vascular surgery plans on taking the patient to the vascular lab tomorrow on 04/05/2024 for left lower extremity angiogram with possible intervention.  I discussed in detail at the bedside with the patient this afternoon the procedure, benefits, risk, and complications.  Patient verbalizes  understanding and wishes to proceed.  I answered all his questions today.  Patient will be made n.p.o. after midnight for procedure tomorrow.  Patient does currently take aspirin  81 mg daily.  He states he last took it today.  I also had a detailed discussion today about the patient possibly needing temporary hemodialysis after the procedure due to the use of dye and his current kidney status.  Patient's current BUN is 39, creatinine 3.07 and a GFR of 20.  Patient verbalizes understanding and wishes to proceed with the angiogram tomorrow.   -I discussed the case in detail with Dr. Cordella Shawl MD and he agrees with the plan.   Gwendlyn JONELLE [facility]ank [facility] 04/04/2024 3:38 PM

## 2024-04-04 NOTE — ED Provider Notes (Signed)
 [facility] Provider Note    Event Date/Time   First MD Initiated Contact with Patient 04/04/24 1350     (approximate)   History   Wound Infection   HPI  Ernest Carpenter is a 78 year old male with history of CAD, HTN, DM, CHF presenting to the emergency department for evaluation of gangrene of the left toe.  Patient was seen in podiatry clinic today.  There, there was concerns for gangrene of his left fifth left toe patient was sent to the ER for further evaluation.  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  Denies fevers.     Physical Exam   Triage Vital Signs: ED Triage Vitals  Encounter Vitals Group     BP 04/04/24 1338 (!) 112/59     Girls Systolic BP Percentile --      Girls Diastolic BP Percentile --      Boys Systolic BP Percentile --      Boys Diastolic BP Percentile --      Pulse Rate 04/04/24 1338 66     Resp 04/04/24 1338 16     Temp 04/04/24 1338 97.7 F (36.5 C)     Temp Source 04/04/24 1338 Oral     SpO2 04/04/24 1338 98 %     Weight 04/04/24 1339 150 lb (68 kg)     Height 04/04/24 1339 5' 11 (1.803 m)     Head Circumference --      Peak Flow --      Pain Score 04/04/24 1338 2     Pain Loc --      Pain Education --      Exclude from Growth Chart --     Most recent vital signs: Vitals:   04/04/24 1338  BP: (!) 112/59  Pulse: 66  Resp: 16  Temp: 97.7 F (36.5 C)  SpO2: 98%     General: Awake, interactive  CV:  Regular rate, good peripheral perfusion.  Resp:  Unlabored respirations.  Abd:  Nondistended.  Neuro:  Symmetric facial movement, fluid speech MSK:  There is a dressing in place over the left foot.  When this is taken down there is black necrotic discoloration noted of the left fifth toe with areas of erythema extending proximally and mild swelling compared to contralateral side.  DP pulse via Doppler.  See images below.     ED Results / Procedures / Treatments    Labs (all labs ordered are listed, but only abnormal results are displayed) Labs Reviewed  COMPREHENSIVE METABOLIC PANEL WITH GFR - Abnormal; Notable for the following components:      Result Value   CO2 21 (*)    Glucose, Bld 155 (*)    BUN 39 (*)    Creatinine, Ser 3.07 (*)    Calcium  8.6 (*)    Albumin 2.7 (*)    AST 51 (*)    ALT 46 (*)    GFR, Estimated 20 (*)    All other components within normal limits  CBC WITH DIFFERENTIAL/PLATELET - Abnormal; Notable for the following components:   WBC 11.3 (*)    Hemoglobin 12.3 (*)    HCT 37.6 (*)    Platelets 517 (*)    Monocytes Absolute 1.2 (*)    All other components within normal limits  CULTURE, BLOOD (SINGLE)  CULTURE, BLOOD (SINGLE)  LACTIC ACID, PLASMA  URINALYSIS, W/ REFLEX TO CULTURE (INFECTION SUSPECTED)     EKG EKG  independently reviewed and interpreted by myself demonstrates:    RADIOLOGY Imaging independently reviewed and interpreted by myself demonstrates:   Formal Radiology Read:  No results found.  PROCEDURES:  Critical Care performed: No  Procedures   MEDICATIONS ORDERED IN ED: Medications  metroNIDAZOLE (FLAGYL) IVPB 500 mg (500 mg Intravenous New Bag/Given 04/04/24 1427)  vancomycin  (VANCOCIN ) IVPB 1000 mg/200 mL premix (1,000 mg Intravenous New Bag/Given 04/04/24 1427)  sodium chloride  0.9 % bolus 1,000 mL (has no administration in time range)  ceFEPIme (MAXIPIME) 2 g in sodium chloride  0.9 % 100 mL IVPB (2 g Intravenous New Bag/Given 04/04/24 1425)     IMPRESSION / MDM / ASSESSMENT AND PLAN / ED COURSE  I reviewed the triage vital signs and the nursing notes.  Differential diagnosis includes, but is not limited to, diabetic foot infection with gangrene, cellulitis, no evidence of acute arterial compromise  Patient's presentation is most consistent with acute presentation with potential threat to life or bodily function.  78 year old male presenting to the emergency department for  evaluation of gangrenous toe.  Stable vitals on presentation.  Labs with leukocytosis WC of 11.3, mild anemia.  CMP notable for significant AKI with a creatinine of 3.07, mild transaminitis noted.  Most recent creatinine 1.16 in April of this year.  Will order IV fluids for possible prerenal etiology and postvoid residual to evaluate for possible obstructive process.  Ordered for blood cultures and blood spectrum antibiotics, does not meet SIRS criteria.  Case reviewed with Dr. Ashley.  He does recommend inpatient vascular surgery consultation, MRI for further evaluation, admission to medicine service.  Will reach out to hospitalist team.  Case reviewed with hospitalist team.  They will evaluate for anticipated admission.     FINAL CLINICAL IMPRESSION(S) / ED DIAGNOSES   Final diagnoses:  Gangrene of toe of left foot (HCC)  AKI (acute kidney injury) (HCC)     Rx / DC Orders   ED Discharge Orders     None        Note:  This document was prepared using Dragon voice recognition software and may include unintentional dictation errors.   Levander Slate, MD 04/04/24 [phone]

## 2024-04-04 NOTE — Assessment & Plan Note (Addendum)
 Patient presents for evaluation of left fifth toe gangrene and foul-smelling ulceration involving the lateral portion of the left foot. Concerning for possible osteomyelitis Will treat patient empirically with linezolid and cefepime Consult vascular surgery for evaluation for possible PAD Consult podiatry

## 2024-04-04 NOTE — Assessment & Plan Note (Signed)
 Most likely prerenal from poor oral intake in the setting of diuretic and ACE inhibitor use. Hold lisinopril  and hydrochlorothiazide  Aggressive IV fluid hydration Repeat renal parameters in a.m.

## 2024-04-04 NOTE — Assessment & Plan Note (Addendum)
 Hold aspirin  and Plavix  for planned procedure Continue atenolol  with holding parameters and statins

## 2024-04-04 NOTE — Assessment & Plan Note (Signed)
 Patient noted to have ulceration involving the lateral portion of the left foot with a foul smell  Follow-up on results of MRI of the left foot to rule out osteomyelitis Continue empiric antibiotic therapy with Zyvox and cefepime Vascular surgery and podiatry consult

## 2024-04-04 NOTE — Progress Notes (Signed)
 IVF stopped for pt to go to MRI. Pt back from MRI 2150.

## 2024-04-04 NOTE — Assessment & Plan Note (Signed)
 Continue atenolol  and amlodipine 

## 2024-04-05 ENCOUNTER — Encounter
Admission: EM | Disposition: A | Discharge status: Home Health | Payer: Self-pay | Source: Ambulatory Visit | Attending: Obstetrics and Gynecology

## 2024-04-05 ENCOUNTER — Inpatient Hospital Stay

## 2024-04-05 DIAGNOSIS — M86172 Other acute osteomyelitis, left ankle and foot: Secondary | ICD-10-CM | POA: Diagnosis not present

## 2024-04-05 DIAGNOSIS — N178 Other acute kidney failure: Secondary | ICD-10-CM

## 2024-04-05 DIAGNOSIS — E872 Acidosis, unspecified: Secondary | ICD-10-CM | POA: Insufficient documentation

## 2024-04-05 DIAGNOSIS — N1832 Chronic kidney disease, stage 3b: Secondary | ICD-10-CM

## 2024-04-05 DIAGNOSIS — I70201 Unspecified atherosclerosis of native arteries of extremities, right leg: Secondary | ICD-10-CM

## 2024-04-05 DIAGNOSIS — I70262 Atherosclerosis of native arteries of extremities with gangrene, left leg: Secondary | ICD-10-CM | POA: Diagnosis not present

## 2024-04-05 DIAGNOSIS — D75838 Other thrombocytosis: Secondary | ICD-10-CM | POA: Insufficient documentation

## 2024-04-05 DIAGNOSIS — I96 Gangrene, not elsewhere classified: Secondary | ICD-10-CM | POA: Diagnosis not present

## 2024-04-05 HISTORY — PX: LOWER EXTREMITY ANGIOGRAPHY: CATH118251

## 2024-04-05 LAB — BASIC METABOLIC PANEL WITH GFR
Anion gap: 8 (ref 5–15)
BUN: 38 mg/dL — ABNORMAL HIGH (ref 8–23)
CO2: 21 mmol/L — ABNORMAL LOW (ref 22–32)
Calcium: 8.1 mg/dL — ABNORMAL LOW (ref 8.9–10.3)
Chloride: 108 mmol/L (ref 98–111)
Creatinine, Ser: 2.7 mg/dL — ABNORMAL HIGH (ref 0.61–1.24)
GFR, Estimated: 24 mL/min — ABNORMAL LOW (ref >60)
Glucose, Bld: 89 mg/dL (ref 70–99)
Potassium: 4 mmol/L (ref 3.5–5.1)
Sodium: 137 mmol/L (ref 135–145)

## 2024-04-05 LAB — GLUCOSE, CAPILLARY
Glucose-Capillary: 79 mg/dL (ref 70–99)
Glucose-Capillary: 108 mg/dL — ABNORMAL HIGH (ref 70–99)
Glucose-Capillary: 77 mg/dL (ref 70–99)
Glucose-Capillary: 71 mg/dL (ref 70–99)
Glucose-Capillary: 108 mg/dL — ABNORMAL HIGH (ref 70–99)
Glucose-Capillary: 246 mg/dL — ABNORMAL HIGH (ref 70–99)

## 2024-04-05 LAB — CBC
HCT: 31 % — ABNORMAL LOW (ref 39.0–52.0)
Hemoglobin: 10.2 g/dL — ABNORMAL LOW (ref 13.0–17.0)
MCH: 26.9 pg (ref 26.0–34.0)
MCHC: 32.9 g/dL (ref 30.0–36.0)
MCV: 81.8 fL (ref 80.0–100.0)
Platelets: 434 10*3/uL — ABNORMAL HIGH (ref 150–400)
RBC: 3.79 MIL/uL — ABNORMAL LOW (ref 4.22–5.81)
RDW: 14.3 % (ref 11.5–15.5)
WBC: 9.8 10*3/uL (ref 4.0–10.5)
nRBC: 0 % (ref 0.0–0.2)

## 2024-04-05 SURGERY — LOWER EXTREMITY ANGIOGRAPHY
Anesthesia: Moderate Sedation | Laterality: Left

## 2024-04-05 MED ORDER — HEPARIN BOLUS VIA INFUSION
4000.0000 [IU] | Freq: Once | INTRAVENOUS | Status: AC
  Administered 2024-04-05: 4000 [IU] via INTRAVENOUS
  Filled 2024-04-05: qty 4000

## 2024-04-05 MED ORDER — HEPARIN SODIUM (PORCINE) 1000 UNIT/ML IJ SOLN
INTRAMUSCULAR | Status: DC | PRN
  Administered 2024-04-05: 6000 [IU] via INTRAVENOUS

## 2024-04-05 MED ORDER — MIDAZOLAM HCL 2 MG/ML PO SYRP: 8.0000 mg | ORAL_SOLUTION | Freq: Once | ORAL | Status: DC | PRN

## 2024-04-05 MED ORDER — METHYLPREDNISOLONE SODIUM SUCC 125 MG IJ SOLR
INTRAMUSCULAR | Status: AC
  Filled 2024-04-05: qty 2

## 2024-04-05 MED ORDER — GABAPENTIN 300 MG PO CAPS
300.0000 mg | ORAL_CAPSULE | Freq: Three times a day (TID) | ORAL | Status: DC
  Administered 2024-04-05 – 2024-04-10 (×14): 300 mg via ORAL
  Filled 2024-04-05 (×14): qty 1

## 2024-04-05 MED ORDER — SODIUM CHLORIDE 0.9 % IV SOLN: INTRAVENOUS | Status: DC

## 2024-04-05 MED ORDER — MIDAZOLAM HCL 2 MG/2ML IJ SOLN
INTRAMUSCULAR | Status: DC | PRN
  Administered 2024-04-05: 2 mg via INTRAVENOUS

## 2024-04-05 MED ORDER — FENTANYL CITRATE (PF) 100 MCG/2ML IJ SOLN
INTRAMUSCULAR | Status: DC | PRN
  Administered 2024-04-05: 50 ug via INTRAVENOUS

## 2024-04-05 MED ORDER — LIDOCAINE HCL (PF) 1 % IJ SOLN
INTRAMUSCULAR | Status: DC | PRN
  Administered 2024-04-05: 10 mL

## 2024-04-05 MED ORDER — DIPHENHYDRAMINE HCL 50 MG/ML IJ SOLN
50.0000 mg | Freq: Once | INTRAMUSCULAR | Status: AC | PRN
  Administered 2024-04-05: 50 mg via INTRAVENOUS

## 2024-04-05 MED ORDER — DIPHENHYDRAMINE HCL 50 MG/ML IJ SOLN
INTRAMUSCULAR | Status: AC
Start: 2024-04-05 — End: 2024-04-05
  Filled 2024-04-05: qty 1

## 2024-04-05 MED ORDER — METRONIDAZOLE 500 MG PO TABS
500.0000 mg | ORAL_TABLET | Freq: Two times a day (BID) | ORAL | Status: DC
  Administered 2024-04-05 – 2024-04-07 (×6): 500 mg via ORAL
  Filled 2024-04-05 (×7): qty 1

## 2024-04-05 MED ORDER — FAMOTIDINE 20 MG PO TABS
40.0000 mg | ORAL_TABLET | Freq: Once | ORAL | Status: AC | PRN
  Administered 2024-04-05: 40 mg via ORAL

## 2024-04-05 MED ORDER — IODIXANOL 320 MG/ML IV SOLN
INTRAVENOUS | Status: DC | PRN
  Administered 2024-04-05: 25 mL

## 2024-04-05 MED ORDER — SODIUM CHLORIDE 0.9 % IV SOLN: INTRAVENOUS | Status: AC

## 2024-04-05 MED ORDER — FENTANYL CITRATE (PF) 100 MCG/2ML IJ SOLN
INTRAMUSCULAR | Status: AC
  Filled 2024-04-05: qty 2

## 2024-04-05 MED ORDER — SODIUM CHLORIDE 0.9 % IV SOLN
250.0000 mL | INTRAVENOUS | Status: AC | PRN
  Administered 2024-04-05: 250 mL via INTRAVENOUS

## 2024-04-05 MED ORDER — METHYLPREDNISOLONE SODIUM SUCC 125 MG IJ SOLR
125.0000 mg | Freq: Once | INTRAMUSCULAR | Status: AC | PRN
Start: 2024-04-05 — End: 2024-04-05
  Administered 2024-04-05: 125 mg via INTRAVENOUS

## 2024-04-05 MED ORDER — CEFAZOLIN SODIUM-DEXTROSE 2-4 GM/100ML-% IV SOLN
INTRAVENOUS | Status: AC
  Filled 2024-04-05: qty 100

## 2024-04-05 MED ORDER — SODIUM CHLORIDE 0.9% FLUSH
3.0000 mL | Freq: Two times a day (BID) | INTRAVENOUS | Status: DC
  Administered 2024-04-05 – 2024-04-09 (×6): 3 mL via INTRAVENOUS

## 2024-04-05 MED ORDER — SODIUM CHLORIDE 0.9% FLUSH: 3.0000 mL | INTRAVENOUS | Status: DC | PRN

## 2024-04-05 MED ORDER — ASPIRIN 81 MG PO TBEC
81.0000 mg | DELAYED_RELEASE_TABLET | Freq: Every day | ORAL | Status: DC
  Administered 2024-04-05 – 2024-04-10 (×5): 81 mg via ORAL
  Filled 2024-04-05 (×5): qty 1

## 2024-04-05 MED ORDER — FAMOTIDINE 20 MG PO TABS
ORAL_TABLET | ORAL | Status: AC
  Filled 2024-04-05: qty 2

## 2024-04-05 MED ORDER — CEFAZOLIN SODIUM-DEXTROSE 2-4 GM/100ML-% IV SOLN: 2.0000 g | INTRAVENOUS | Status: DC

## 2024-04-05 MED ORDER — HEPARIN (PORCINE) 25000 UT/250ML-% IV SOLN
800.0000 [IU]/h | INTRAVENOUS | Status: DC
  Administered 2024-04-05: 800 [IU]/h via INTRAVENOUS
  Filled 2024-04-05: qty 250

## 2024-04-05 MED ORDER — HEPARIN SODIUM (PORCINE) 1000 UNIT/ML IJ SOLN
INTRAMUSCULAR | Status: AC
  Filled 2024-04-05: qty 10

## 2024-04-05 MED ORDER — MIDAZOLAM HCL 5 MG/5ML IJ SOLN
INTRAMUSCULAR | Status: AC
Start: 2024-04-05 — End: 2024-04-05
  Filled 2024-04-05: qty 5

## 2024-04-05 MED ORDER — HEPARIN (PORCINE) IN NACL 1000-0.9 UT/500ML-% IV SOLN
INTRAVENOUS | Status: DC | PRN
  Administered 2024-04-05: 1000 mL

## 2024-04-05 SURGICAL SUPPLY — 17 items
BALLOON LUTONIX DCB 6X60X130 (BALLOONS) IMPLANT
CATH ANGIO 5F PIGTAIL 100CM (CATHETERS) IMPLANT
CATH BEACON 5 .035 65 RIM TIP (CATHETERS) IMPLANT
COVER PROBE ULTRASOUND 5X96 (MISCELLANEOUS) IMPLANT
DEVICE PRESTO INFLATION (MISCELLANEOUS) IMPLANT
DEVICE STARCLOSE SE CLOSURE (Vascular Products) IMPLANT
GLIDEWIRE ADV .035X260CM (WIRE) IMPLANT
GOWN STRL REUS W/ TWL LRG LVL3 (GOWN DISPOSABLE) ×1 IMPLANT
NDL ENTRY 21GA 7CM ECHOTIP (NEEDLE) IMPLANT
NEEDLE ENTRY 21GA 7CM ECHOTIP (NEEDLE) ×1 IMPLANT
PACK ANGIOGRAPHY (CUSTOM PROCEDURE TRAY) ×1 IMPLANT
SET INTRO CAPELLA COAXIAL (SET/KITS/TRAYS/PACK) IMPLANT
SHEATH BRITE TIP 5FRX11 (SHEATH) IMPLANT
SHEATH RAABE 6FR (SHEATH) IMPLANT
STENT LIFESTENT 5F 7X60X135 (Permanent Stent) IMPLANT
WIRE J 3MM .035X145CM (WIRE) IMPLANT
WIRE SUPRACORE 300CM (WIRE) IMPLANT

## 2024-04-05 NOTE — Consult Note (Signed)
 [facility]  CONSULT NOTE    Date: 04/05/2024                  Patient Name:  Ernest Carpenter  MRN: [id number]  DOB: 1946-06-28  Age / Sex: 78 y.o., male         PCP: Steva Clotilda DEL, NP                 Service Requesting Consult: TRH                 Reason for Consult: Acute kidney injury on chronic kidney disease IIIa            History of Present Illness: Ernest Carpenter is a 78 y.o.  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  Patient states that due to his PAD, he has had trouble healing of small wound on his left toe.  States that over the past few weeks, his toe has become dark in color and now has a odor.  Patient is seen with sister at bedside.  They both deny known fever or chills.  No shortness of breath.  Patient denies NSAID use.  Does not currently follow with nephrologist outpatient.  Reports appropriate oral intake, denies nausea, vomiting, or diarrhea.  Labs on ED arrival concerning for serum bicarb 21, BUN 39, creatinine 3.07 with GFR 20, white count 11.3 with GFR 12.3.  Blood cultures pending.  Left foot MRI concerning for osteomyelitis.  Renal ultrasound negative for obstruction.   Medications: Outpatient medications: Medications Prior to Admission  Medication Sig Dispense Refill Last Dose/Taking   acetaminophen  (TYLENOL ) 500 MG tablet Take 1,000 mg by mouth every 8 (eight) hours as needed for mild pain or moderate pain.   Unknown   amLODipine (NORVASC) 10 MG tablet Take 10 mg by mouth daily.   04/04/2024 Morning   aspirin  81 MG tablet Take 81 mg by mouth daily.   04/03/2024    gabapentin  (NEURONTIN ) 800 MG tablet Take 800 mg by mouth 3 (three) times daily.   04/04/2024 Morning   JARDIANCE 25 MG TABS tablet Take 1 tablet by mouth daily.   04/04/2024 Morning   lisinopril  (ZESTRIL ) 2.5 MG tablet Take 2.5 mg by mouth daily.   04/04/2024 Morning   pantoprazole  (PROTONIX ) 40 MG tablet Take 1 tablet (40 mg total) by mouth 2 (two) times daily. 60 tablet 0 04/04/2024 Morning   rosuvastatin (CRESTOR) 40 MG tablet Take 1 tablet by mouth at bedtime.   04/03/2024 Bedtime   atenolol  (TENORMIN ) 25 MG tablet 25 mg. (Patient not taking: Reported on 04/04/2024)   Not Taking   atorvastatin  (LIPITOR) 10 MG tablet Take 10 mg by mouth daily. (Patient not taking: Reported on 04/04/2024)   Not Taking   clopidogrel  (PLAVIX ) 75 MG tablet Take 75 mg by mouth daily. (Patient not taking: Reported on 08/17/2023)   Not Taking   gabapentin  (NEURONTIN ) 600 MG tablet Take 600 mg by mouth 2 (two) times daily. (Patient not taking: Reported on 04/04/2024)   Not Taking   glipiZIDE (GLUCOTROL) 10 MG tablet Take 10 mg by mouth 2 (two) times daily  before a meal. (Patient not taking: Reported on 04/04/2024)   Not Taking   lisinopril -hydrochlorothiazide  (PRINZIDE ,ZESTORETIC ) 20-25 MG per tablet Take 1 tablet by mouth daily. (Patient not taking: Reported on 04/04/2024)   Not Taking   metFORMIN (GLUCOPHAGE) 1000 MG tablet Take 1,000 mg by mouth 2 (two) times daily with a meal. (Patient not taking: Reported on 04/04/2024)   Not Taking    Current medications: Current Facility-Administered Medications  Medication Dose Route Frequency Provider Last Rate Last Admin   0.9 %  sodium chloride  infusion   Intravenous Continuous Schnier, Cordella MATSU, MD 100 mL/hr at 04/05/24 1710 New Bag at 04/05/24 1710   0.9 %  sodium chloride  infusion  250 mL Intravenous PRN Schnier, Gregory G, MD       amLODipine (NORVASC) tablet 10 mg  10 mg Oral Daily Schnier, Gregory G, MD   10 mg at 04/05/24 0846   cefTRIAXone (ROCEPHIN) 2 g in sodium  chloride 0.9 % 100 mL IVPB  2 g Intravenous Q24H Schnier, Gregory G, MD 200 mL/hr at 04/05/24 1041 2 g at 04/05/24 1041   gabapentin  (NEURONTIN ) capsule 300 mg  300 mg Oral TID Schnier, Gregory G, MD       heparin injection 5,000 Units  5,000 Units Subcutaneous Q8H Schnier, Gregory G, MD       HYDROcodone-acetaminophen  (NORCO/VICODIN) 5-325 MG per tablet 1-2 tablet  1-2 tablet Oral Q4H PRN Schnier, Gregory G, MD   1 tablet at 04/05/24 0846   insulin  aspart (novoLOG ) injection 0-15 Units  0-15 Units Subcutaneous TID WC Schnier, Gregory G, MD   2 Units at 04/04/24 1728   linezolid (ZYVOX) IVPB 600 mg  600 mg Intravenous Q12H Jama Cordella MATSU, MD 300 mL/hr at 04/05/24 0850 600 mg at 04/05/24 0850   metroNIDAZOLE (FLAGYL) tablet 500 mg  500 mg Oral Q12H Schnier, Gregory G, MD   500 mg at 04/05/24 1115   ondansetron  (ZOFRAN ) tablet 4 mg  4 mg Oral Q6H PRN Schnier, Cordella MATSU, MD       Or   ondansetron  (ZOFRAN ) injection 4 mg  4 mg Intravenous Q6H PRN Schnier, Gregory G, MD       pantoprazole  (PROTONIX ) EC tablet 40 mg  40 mg Oral BID Schnier, Gregory G, MD   40 mg at 04/05/24 0846   rosuvastatin (CRESTOR) tablet 40 mg  40 mg Oral QHS Schnier, Gregory G, MD   40 mg at 04/04/24 2209   sodium chloride  flush (NS) 0.9 % injection 3 mL  3 mL Intravenous Q12H Schnier, Cordella MATSU, MD       sodium chloride  flush (NS) 0.9 % injection 3 mL  3 mL Intravenous PRN Schnier, Cordella MATSU, MD          Allergies: Allergies  Allergen Reactions   Ivp Dye [Iodinated Contrast Media] Rash      Past Medical History: Past Medical History:  Diagnosis Date   Anginal pain (HCC)    CHF (congestive heart failure) (HCC)    Coronary artery disease    Diabetes mellitus without complication (HCC)    Hypertension    Myocardial infarction (HCC)    Neuromuscular disorder (HCC)      Past Surgical History: Past Surgical History:  Procedure Laterality Date   BALLOON DILATION  05/12/2023   Procedure: BALLOON DILATION;   Surgeon: Maryruth Ole DASEN, MD;  Location: [facility] ENDOSCOPY;  Service: Endoscopy;;   cardiac stents     x3   COLONOSCOPY N/A 02/13/2015   Procedure:  COLONOSCOPY;  Surgeon: Gladis RAYMOND Mariner, MD;  Location: [facility] ENDOSCOPY;  Service: Endoscopy;  Laterality: N/A;   CORONARY ANGIOPLASTY     ESOPHAGEAL DILATION  08/17/2023   Procedure: ESOPHAGEAL DILATION;  Surgeon: Maryruth Ole DASEN, MD;  Location: [facility] ENDOSCOPY;  Service: Endoscopy;;   ESOPHAGOGASTRODUODENOSCOPY (EGD) WITH PROPOFOL   02/13/2015   Procedure: ESOPHAGOGASTRODUODENOSCOPY (EGD) WITH PROPOFOL ;  Surgeon: Gladis RAYMOND Mariner, MD;  Location: [facility] ENDOSCOPY;  Service: Endoscopy;;   ESOPHAGOGASTRODUODENOSCOPY (EGD) WITH PROPOFOL  N/A 05/12/2023   Procedure: ESOPHAGOGASTRODUODENOSCOPY (EGD) WITH PROPOFOL ;  Surgeon: Maryruth Ole DASEN, MD;  Location: [facility] ENDOSCOPY;  Service: Endoscopy;  Laterality: N/A;   ESOPHAGOGASTRODUODENOSCOPY (EGD) WITH PROPOFOL  N/A 08/17/2023   Procedure: ESOPHAGOGASTRODUODENOSCOPY (EGD) WITH PROPOFOL ;  Surgeon: Maryruth Ole DASEN, MD;  Location: [facility] ENDOSCOPY;  Service: Endoscopy;  Laterality: N/A;   SAVORY DILATION  05/12/2023   Procedure: SAVORY DILATION;  Surgeon: Maryruth Ole DASEN, MD;  Location: [facility] ENDOSCOPY;  Service: Endoscopy;;   TOE AMPUTATION Right    TONSILLECTOMY     UPPER GI ENDOSCOPY       Family History: History reviewed. No pertinent family history.   Social History: Social History   Socioeconomic History   Marital status: Married    Spouse name: Not on file   Number of children: Not on file   Years of education: Not on file   Highest education level: Not on file  Occupational History   Not on file  Tobacco Use   Smoking status: Former    Current packs/day: 0.00    Types: Cigarettes    Quit date: 10/15/2000    Years since quitting: 23.4   Smokeless tobacco: Never  Vaping Use   Vaping status: Never Used  Substance and Sexual Activity   Alcohol use: No   Drug use: No   Sexual  activity: Never  Other Topics Concern   Not on file  Social History Narrative   Not on file   Social Drivers of Health   Financial Resource Strain: Low Risk  (02/24/2024)   Received from [facility]   Overall Financial Resource Strain (CARDIA)    Difficulty of Paying Living Expenses: Not very hard  Food Insecurity: No Food Insecurity (04/04/2024)   Hunger Vital Sign    Worried About Running Out of Food in the Last Year: Never true    Ran Out of Food in the Last Year: Never true  Recent Concern: Food Insecurity - Food Insecurity Present (02/24/2024)   Received from [facility]   Hunger Vital Sign    Within the past 12 months, you worried that your food would run out before you got the money to buy more.: Sometimes true    Within the past 12 months, the food you bought just didn't last and you didn't have money to get more.: Never true  Transportation Needs: No Transportation Needs (04/04/2024)   PRAPARE - Administrator, Civil Service (Medical): No    Lack of Transportation (Non-Medical): No  Physical Activity: Insufficiently Active (10/21/2017)   Received from [facility]   Exercise Vital Sign    Days of Exercise per Week: 4 days    Minutes of Exercise per Session: 30 min  Stress: No Stress Concern Present (10/21/2017)   Received from [facility] - Occupational Stress Questionnaire    Feeling of Stress : Only a little  Social Connections: Moderately Integrated (10/21/2017)   Received from [facility]   Social Connection and Isolation Panel    Frequency of Communication with Friends and Family: More than three times a week    Frequency of Social Gatherings with Friends and Family: More than three times a week    Attends Religious Services: More than 4 times per year    Active Member of [employer] or Organizations: No    Attends Tax inspector Meetings: Never    Marital Status: Married  Catering manager Violence: Not At Risk (04/05/2024)   Humiliation, Afraid, Rape, and Kick questionnaire    Fear of Current or Ex-Partner: No    Emotionally Abused: No    Physically Abused: No    Sexually Abused: No     Review of Systems: Review of Systems  Constitutional:  Negative for chills, fever and malaise/fatigue.  HENT:  Negative for congestion, sore throat and tinnitus.   Eyes:  Negative for blurred vision and redness.  Respiratory:  Negative for cough, shortness of breath and wheezing.   Cardiovascular:  Negative for chest pain, palpitations, claudication and leg swelling.  Gastrointestinal:  Negative for abdominal pain, blood in stool, diarrhea, nausea and vomiting.  Genitourinary:  Negative for flank pain, frequency and hematuria.  Musculoskeletal:  Negative for back pain, falls and myalgias.  Skin:  Negative for rash.       Left foot fifth toe wound  Neurological:  Negative for dizziness, weakness and headaches.  Endo/Heme/Allergies:  Does not bruise/bleed easily.  Psychiatric/Behavioral:  Negative for depression. The patient is not nervous/anxious and does not have insomnia.     Vital Signs: Blood pressure (!) 120/57, pulse 68, temperature (!) 97.5 F (36.4 C), temperature source Oral, resp. rate 20, height 5' 11 (1.803 m), weight 68 kg, SpO2 95%.  Weight trends: Filed Weights   04/04/24 1339  Weight: 68 kg    Physical Exam: General: NAD  Head: Normocephalic, atraumatic. Moist oral mucosal membranes  Eyes: Anicteric  Neck: Supple  Lungs:  Clear to auscultation, normal effort  Heart: Regular rate and rhythm  Abdomen:  Soft, nontender, nondistended  Extremities: Trace peripheral edema.  Neurologic: Alert, oriented  Skin: No lesions  Access: None     Lab results: Basic Metabolic Panel: Recent Labs  Lab 04/04/24 1343 04/05/24 0437  NA 136 137  K 4.3 4.0  CL 104 108  CO2 21* 21*  GLUCOSE  155* 89  BUN 39* 38*  CREATININE 3.07* 2.70*  CALCIUM  8.6* 8.1*    Liver Function Tests: Recent Labs  Lab 04/04/24 1343  AST 51*  ALT 46*  ALKPHOS 116  BILITOT 1.1  PROT 7.4  ALBUMIN 2.7*   No results for input(s): LIPASE, AMYLASE in the last 168 hours. No results for input(s): AMMONIA in the last 168 hours.  CBC: Recent Labs  Lab 04/04/24 1343 04/05/24 0437  WBC 11.3* 9.8  NEUTROABS 7.7  --   HGB 12.3* 10.2*  HCT 37.6* 31.0*  MCV 82.1 81.8  PLT 517* 434*    Cardiac Enzymes: No results for input(s): CKTOTAL, CKMB, CKMBINDEX, TROPONINI in the last 168 hours.  BNP: Invalid input(s): POCBNP  CBG: Recent Labs  Lab 04/05/24 0756 04/05/24 1106 04/05/24 1413 04/05/24 1613 04/05/24 1706  GLUCAP 79 108* 77 71 108*    Microbiology: Results for orders placed or performed during the hospital encounter of 04/04/24  Blood culture (single)     Status: None (Preliminary result)   Collection Time: 04/04/24  1:43 PM   Specimen: BLOOD  Result Value Ref Range Status   Specimen Description BLOOD RIGHT ANTECUBITAL  Final   Special Requests   Final    BOTTLES DRAWN AEROBIC AND ANAEROBIC Blood Culture results may not be optimal due to an inadequate volume of blood received in culture bottles   Culture   Final    NO GROWTH < 24 HOURS Performed at [facility], [address], [place], [place] [address]    Report Status PENDING  Incomplete  Blood culture (single)     Status: None (Preliminary result)   Collection Time: 04/04/24  2:20 PM   Specimen: BLOOD  Result Value Ref Range Status   Specimen Description BLOOD RIGHT ANTECUBITAL  Final   Special Requests   Final    BOTTLES DRAWN AEROBIC AND ANAEROBIC Blood Culture results may not be optimal due to an inadequate volume of blood received in culture bottles   Culture   Final    NO GROWTH < 24 HOURS Performed at [facility], [address] [place], [place] [address]    Report Status  PENDING  Incomplete    Coagulation Studies: No results for input(s): LABPROT, INR in the last 72 hours.  Urinalysis: No results for input(s): COLORURINE, LABSPEC, PHURINE, GLUCOSEU, HGBUR, BILIRUBINUR, KETONESUR, PROTEINUR, UROBILINOGEN, NITRITE, LEUKOCYTESUR in the last 72 hours.  Invalid input(s): APPERANCEUR    Imaging: PERIPHERAL VASCULAR CATHETERIZATION Result Date: 04/05/2024 See surgical note for result.  US  RENAL Result Date: 04/05/2024 CLINICAL DATA:  [id number] Acute kidney failure (HCC) [id number] EXAM: RENAL / URINARY TRACT ULTRASOUND COMPLETE COMPARISON:  None Available. FINDINGS: Right Kidney: Renal measurements: 10.3 x 5.3 x 6.5 cm = volume: 187 mL. Mildly increased echogenicity. A couple of small cysts present measuring up to 1.3 cm. No hydronephrosis or nephrolithiasis. Left Kidney: Renal measurements: 11.2 x 5.2 x 4.9 cm = volume: 150 mL. Mildly increased echogenicity. Multiple renal cysts, the largest measures 7.8 x 8 x 7.2 cm. No hydronephrosis or nephrolithiasis. Bladder: Appears normal for degree of bladder distention. Other: None. IMPRESSION: 1. No hydronephrosis or nephrolithiasis. 2. Renal parenchymal changes, which can be seen in both acute and chronic medical renal disease. Electronically Signed   By: Rogelia Myers M.D.   On: 04/05/2024 12:37   MR FOOT LEFT W WO CONTRAST Result Date: 04/04/2024 EXAM DESCRIPTION: MR FOOT LEFT W WO CONTRAST CLINICAL HISTORY: Soft tissue infection suspected, foot, xray done COMPARISON: None Available. TECHNIQUE: MRI of the foot is performed according to our usual protocol with multiplanar multi sequence imaging. FINDINGS: There is a large soft tissue wound/ulceration about the distal fourth and fifth digits. Phlegmonous changes in the soft tissues without drainable collection. There is severe marrow edema to the fifth metatarsal head extending proximally. Areas of cortical destruction to the metatarsal head as well as  the proximal and middle phalanx. Findings consistent with osteomyelitis. Suggestion of mild gas in the soft tissues surrounding the fifth metatarsal head. Correlate with x-rays. There is moderate to severe marrow edema on either side of the fourth MTP joint. Associated joint effusion. No definitive cortical erosion/destruction. Findings are suspicious for early osteomyelitis and septic arthritis. The marrow signal is otherwise unremarkable. Moderate atrophy to the plantar musculature. The tendons are unremarkable. Moderate dorsal subcutaneous edema. IMPRESSION: Large soft tissue wound seen about the distal fourth and fifth digits. Phlegmonous changes without drainable collection. Suspicion of mild gas in the soft tissues about the head of the fifth metatarsal. Correlate with x-rays. Severe marrow edema to the fifth metatarsal head as  well as cortical destruction. Probable cortical destruction to the more distal fifth digit as well. Again correlate with x-rays. 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 Moderate to severe marrow edema on either side of the fourth MTP joint with associated moderate joint effusion. This is probably septic arthritis/osteomyelitis. Moderate dorsal subcutaneous edema suggesting cellulitis. Electronically signed by: Reyes Frees MD 04/04/2024 10:51 PM EDT RP Workstation: [id number]     Assessment & Plan: Mr. DAQUARIUS DUBEAU is a 78 y.o.  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  Acute kidney injury on chronic kidney disease stage III A.  Baseline creatinine appears to be 1.38 with GFR 53 on February 03, 2024.  Acute kidney injury likely secondary to poor oral intake, diuretic/ACE use, and infection.  Renal ultrasound negative  for obstruction.  Diuretics and ACE inhibitor's currently held.  No acute indication for dialysis.  Continue supportive measures including IV hydration.  Patient encouraged to maintain oral intake.  2. Diabetes mellitus type II with chronic kidney disease/renal manifestations:noninsulin dependent. Home regimen includes metformin. Most recent hemoglobin A1c is 7.0 on 04/04/24.   3.  Hypertension with chronic kidney disease.  Home regimen includes lisinopril  and HCTZ.  All currently held at this time.  4. Anemia of chronic kidney disease.  Normocytic Lab Results  Component Value Date   HGB 10.2 (L) 04/05/2024  Hemoglobin acceptable at this time.   LOS: 1 Olyvia Gopal 7/1/20256:08 PM

## 2024-04-05 NOTE — Consult Note (Signed)
 ORTHOPAEDIC CONSULTATION  REQUESTING PHYSICIAN: Laurita Pillion, MD  Chief Complaint: Gangrene left foot  HPI: Ernest Carpenter is a 78 y.o. male who complains of worsening gangrenous changes to his left foot.  He had been seen by myself in the outpatient clinic with a noted ulceration.  It progressed quite noticeably from his last visit to most recent visit.  Yesterday during outpatient evaluation noted necrotic tissue diffusely to the fifth ray and extending under the fourth toe.  I recommended admission to the hospital for IV antibiotics further workup and vascular evaluation.  Past Medical History:  Diagnosis Date   Anginal pain (HCC)    CHF (congestive heart failure) (HCC)    Coronary artery disease    Diabetes mellitus without complication (HCC)    Hypertension    Myocardial infarction (HCC)    Neuromuscular disorder (HCC)    Past Surgical History:  Procedure Laterality Date   BALLOON DILATION  05/12/2023   Procedure: BALLOON DILATION;  Surgeon: Maryruth Ole DASEN, MD;  Location: [facility] ENDOSCOPY;  Service: Endoscopy;;   cardiac stents     x3   COLONOSCOPY N/A 02/13/2015   Procedure: COLONOSCOPY;  Surgeon: Gladis RAYMOND Mariner, MD;  Location: [facility] ENDOSCOPY;  Service: Endoscopy;  Laterality: N/A;   CORONARY ANGIOPLASTY     ESOPHAGEAL DILATION  08/17/2023   Procedure: ESOPHAGEAL DILATION;  Surgeon: Maryruth Ole DASEN, MD;  Location: [facility] ENDOSCOPY;  Service: Endoscopy;;   ESOPHAGOGASTRODUODENOSCOPY (EGD) WITH PROPOFOL   02/13/2015   Procedure: ESOPHAGOGASTRODUODENOSCOPY (EGD) WITH PROPOFOL ;  Surgeon: Gladis RAYMOND Mariner, MD;  Location: [facility] ENDOSCOPY;  Service: Endoscopy;;   ESOPHAGOGASTRODUODENOSCOPY (EGD) WITH PROPOFOL  N/A 05/12/2023   Procedure: ESOPHAGOGASTRODUODENOSCOPY (EGD) WITH PROPOFOL ;  Surgeon: Maryruth Ole DASEN, MD;  Location: [facility] ENDOSCOPY;  Service: Endoscopy;  Laterality: N/A;   ESOPHAGOGASTRODUODENOSCOPY (EGD) WITH PROPOFOL  N/A 08/17/2023   Procedure:  ESOPHAGOGASTRODUODENOSCOPY (EGD) WITH PROPOFOL ;  Surgeon: Maryruth Ole DASEN, MD;  Location: [facility] ENDOSCOPY;  Service: Endoscopy;  Laterality: N/A;   SAVORY DILATION  05/12/2023   Procedure: SAVORY DILATION;  Surgeon: Maryruth Ole DASEN, MD;  Location: [facility] ENDOSCOPY;  Service: Endoscopy;;   TOE AMPUTATION Right    TONSILLECTOMY     UPPER GI ENDOSCOPY     Social History   Socioeconomic History   Marital status: Married    Spouse name: Not on file   Number of children: Not on file   Years of education: Not on file   Highest education level: Not on file  Occupational History   Not on file  Tobacco Use   Smoking status: Former    Current packs/day: 0.00    Types: Cigarettes    Quit date: 10/15/2000    Years since quitting: 23.4   Smokeless tobacco: Never  Vaping Use   Vaping status: Never Used  Substance and Sexual Activity   Alcohol use: No   Drug use: No   Sexual activity: Never  Other Topics Concern   Not on file  Social History Narrative   Not on file   Social Drivers of Health   Financial Resource Strain: Low Risk  (02/24/2024)   Received from [facility]   Overall Financial Resource Strain (CARDIA)    Difficulty of Paying Living Expenses: Not very hard  Food Insecurity: No Food Insecurity (04/04/2024)   Hunger Vital Sign    Worried About Running Out of Food in the Last Year: Never true    Ran Out of Food in the Last Year: Never true  Recent Concern: Food Insecurity -  Food Insecurity Present (02/24/2024)   Received from [facility]   Hunger Vital Sign    Within the past 12 months, you worried that your food would run out before you got the money to buy more.: Sometimes true    Within the past 12 months, the food you bought just didn't last and you didn't have money to get more.: Never true  Transportation Needs: No Transportation Needs (04/04/2024)   PRAPARE - Administrator, Civil Service (Medical): No    Lack of  Transportation (Non-Medical): No  Physical Activity: Insufficiently Active (10/21/2017)   Received from [facility]   Exercise Vital Sign    Days of Exercise per Week: 4 days    Minutes of Exercise per Session: 30 min  Stress: No Stress Concern Present (10/21/2017)   Received from [facility] - Occupational Stress Questionnaire    Feeling of Stress : Only a little  Social Connections: Moderately Integrated (10/21/2017)   Received from [facility]   Social Connection and Isolation Panel    Frequency of Communication with Friends and Family: More than three times a week    Frequency of Social Gatherings with Friends and Family: More than three times a week    Attends Religious Services: More than 4 times per year    Active Member of [employer] or Organizations: No    Attends Banker Meetings: Never    Marital Status: Married   History reviewed. No pertinent family history. Allergies  Allergen Reactions   Ivp Dye [Iodinated Contrast Media] Rash   Prior to Admission medications   Medication Sig Start Date End Date Taking? Authorizing Provider  acetaminophen  (TYLENOL ) 500 MG tablet Take 1,000 mg by mouth every 8 (eight) hours as needed for mild pain or moderate pain.   Yes [provider]  amLODipine (NORVASC) 10 MG tablet Take 10 mg by mouth daily.   Yes [provider]  aspirin  81 MG tablet Take 81 mg by mouth daily.   Yes [provider]  gabapentin  (NEURONTIN ) 800 MG tablet Take 800 mg by mouth 3 (three) times daily.   Yes [provider]  JARDIANCE 25 MG TABS tablet Take 1 tablet by mouth daily. 12/03/23  Yes [provider]  lisinopril  (ZESTRIL ) 2.5 MG tablet Take 2.5 mg by mouth daily.   Yes [provider]  pantoprazole  (PROTONIX ) 40 MG tablet Take 1 tablet (40 mg total) by mouth 2 (two) times daily. 06/07/17  Yes Wieting,  Revis, MD  rosuvastatin (CRESTOR) 40 MG tablet Take 1 tablet by mouth at bedtime. 12/03/23  Yes [provider]  atenolol  (TENORMIN ) 25 MG tablet 25 mg. Patient not taking: Reported on 04/04/2024    [provider]  atorvastatin  (LIPITOR) 10 MG tablet Take 10 mg by mouth daily. Patient not taking: Reported on 04/04/2024    [provider]  clopidogrel  (PLAVIX ) 75 MG tablet Take 75 mg by mouth daily. Patient not taking: Reported on 08/17/2023    [provider]  gabapentin  (NEURONTIN ) 600 MG tablet Take 600 mg by mouth 2 (two) times daily. Patient not taking: Reported on 04/04/2024    [provider]  glipiZIDE (GLUCOTROL) 10 MG tablet Take 10 mg by mouth 2 (two) times daily before a meal. Patient not taking: Reported on 04/04/2024    [provider]  lisinopril -hydrochlorothiazide  (PRINZIDE ,ZESTORETIC ) 20-25 MG per tablet Take 1 tablet  by mouth daily. Patient not taking: Reported on 04/04/2024    [provider]  metFORMIN (GLUCOPHAGE) 1000 MG tablet Take 1,000 mg by mouth 2 (two) times daily with a meal. Patient not taking: Reported on 04/04/2024    [provider]   US  RENAL Result Date: 04/05/2024 CLINICAL DATA:  [id number] Acute kidney failure (HCC) [id number] EXAM: RENAL / URINARY TRACT ULTRASOUND COMPLETE COMPARISON:  None Available. FINDINGS: Right Kidney: Renal measurements: 10.3 x 5.3 x 6.5 cm = volume: 187 mL. Mildly increased echogenicity. A couple of small cysts present measuring up to 1.3 cm. No hydronephrosis or nephrolithiasis. Left Kidney: Renal measurements: 11.2 x 5.2 x 4.9 cm = volume: 150 mL. Mildly increased echogenicity. Multiple renal cysts, the largest measures 7.8 x 8 x 7.2 cm. No hydronephrosis or nephrolithiasis. Bladder: Appears normal for degree of bladder distention. Other: None. IMPRESSION: 1. No hydronephrosis or nephrolithiasis. 2. Renal parenchymal changes, which can be seen in both acute and chronic medical  renal disease. Electronically Signed   By: Rogelia Myers M.D.   On: 04/05/2024 12:37   MR FOOT LEFT W WO CONTRAST Result Date: 04/04/2024 EXAM DESCRIPTION: MR FOOT LEFT W WO CONTRAST CLINICAL HISTORY: Soft tissue infection suspected, foot, xray done COMPARISON: None Available. TECHNIQUE: MRI of the foot is performed according to our usual protocol with multiplanar multi sequence imaging. FINDINGS: There is a large soft tissue wound/ulceration about the distal fourth and fifth digits. Phlegmonous changes in the soft tissues without drainable collection. There is severe marrow edema to the fifth metatarsal head extending proximally. Areas of cortical destruction to the metatarsal head as well as the proximal and middle phalanx. Findings consistent with osteomyelitis. Suggestion of mild gas in the soft tissues surrounding the fifth metatarsal head. Correlate with x-rays. There is moderate to severe marrow edema on either side of the fourth MTP joint. Associated joint effusion. No definitive cortical erosion/destruction. Findings are suspicious for early osteomyelitis and septic arthritis. The marrow signal is otherwise unremarkable. Moderate atrophy to the plantar musculature. The tendons are unremarkable. Moderate dorsal subcutaneous edema. IMPRESSION: Large soft tissue wound seen about the distal fourth and fifth digits. Phlegmonous changes without drainable collection. Suspicion of mild gas in the soft tissues about the head of the fifth metatarsal. Correlate with x-rays. Severe marrow edema to the fifth metatarsal head as well as cortical destruction. Probable cortical destruction to the more distal fifth digit as well. Again correlate with x-rays. 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 Moderate to severe marrow edema on either side of the fourth MTP joint with associated moderate joint effusion. This is probably septic arthritis/osteomyelitis. Moderate dorsal subcutaneous edema suggesting cellulitis.  Electronically signed by: Reyes Frees MD 04/04/2024 10:51 PM EDT RP Workstation: [id number]    Positive ROS: All other systems have been reviewed and were otherwise negative with the exception of those mentioned in the HPI and as above.  12 point ROS was performed.  Physical Exam: General: Alert and oriented.  No apparent distress.  Vascular:  Left foot:Dorsalis Pedis:  absent Posterior Tibial:  absent  Right foot: Not evaluated today  Neuro: Patient is grossly neuropathic to the left foot  Derm: Large area of necrotic tissue diffusely from the fifth toe along the lateral fifth ray with ulceration plantar just lateral to the fourth MTPJ.  Ortho/MS: Edema to the left foot and ankle is noted.    Assessment: Gangrene with osteomyelitis left fifth ray Likely septic arthritis left fourth toe joint Diabetes with peripheral vascular disease and  diabetic foot ulceration left foot  Plan: Discussed the plan with the patient going forward.  At this point vascular has been consulted and they are planning to perform angio.  Will need his circulation reestablished prior to any attempts at limb salvage and debridement.  I have personally evaluated the MRI and there is concern for septic arthritis of the fourth toe joint with osteomyelitis and gangrenous changes of the entire fifth toe and metatarsophalangeal joint region.  He will need at minimum fifth ray amputation but I discussed there is a strong possibility he will need to undergo transmetatarsal amputation given the amount of necrotic tissue and the lack of ability for wound closure with isolated fifth ray amputation.  With excision of the fourth metatarsal would likely extend to a transmetatarsal at that point.  Will discuss further in the near future.  Will await vascular evaluation and their findings.    Ashley Eva LABOR, DPM Cell [phone]   04/05/2024 12:55 PM

## 2024-04-05 NOTE — Op Note (Signed)
 [facility]  Percutaneous Study/Intervention Procedural Note   Date of Surgery: 04/05/2024  Surgeon:  Cordella JUDITHANN Shawl, MD.  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  Introduction catheter into left lower extremity 3rd order catheter placement              2.    Contrast injection left lower extremity for distal runoff             3.  Percutaneous transluminal angioplasty and stent placement superficial femoral artery.             4.  Star close closure right common femoral arteriotomy  Anesthesia: Conscious sedation was administered under my direct supervision by the interventional radiology RN. IV Versed plus fentanyl were utilized. Continuous ECG, pulse oximetry and blood pressure was monitored throughout the entire procedure.  Conscious sedation was for a total of 37 minutes.  Sheath: 6 [place] Rabie right common femoral retrograde  Contrast: 25 cc  Fluoroscopy Time: 6.4 minutes  Indications:  Ernest Carpenter presents with gangrenous changes of the left fifth ray.  He has known atherosclerotic occlusive disease.  This places him at increased risk for limb loss.  Angiography with intervention is recommended for limb salvage.  Contrast nephropathy in association with his chronic renal insufficiency was also discussed in detail.  The risks and benefits are reviewed all questions answered patient agrees to proceed.  Procedure:  Ernest Carpenter is a 78 y.o. y.o. male who was identified and appropriate procedural time out was performed.  The patient was then placed supine on the table and prepped and draped in the usual sterile fashion.    Ultrasound was placed in the sterile sleeve and the right groin was evaluated the right common femoral artery was echolucent and pulsatile indicating patency.   Image was recorded for the permanent record and under real-time visualization a microneedle was inserted into the common femoral artery microwire followed by a micro-sheath.  A J-wire was then advanced through the micro-sheath and a  5 [place] sheath was then inserted over a J-wire.  AP projection of the distal aorta and bilateral iliac arteries was then obtained by hand-injection.  Subsequently a rim catheter with the stiff angle Glidewire was used to cross the aortic bifurcation the catheter wire were advanced down into the left distal external iliac artery. Oblique view of the femoral bifurcation was then obtained and subsequently the wire was reintroduced and the pigtail catheter negotiated into the SFA representing third order catheter placement. Distal runoff was then performed by hand-injection.  After each image the wire was reintroduced and the catheter was advanced.  6000 units of heparin was then given and allowed to circulate and a 6 [place] Rabie sheath was advanced up and over the bifurcation and positioned in the femoral artery  Ernest Carpenter  catheter and stiff angle Glidewire were then negotiated down into the distal popliteal.  The catheter was removed and a 7 mm x 60 mm life stent was deployed across the lesion.  A 6 mm x 60 mm Lutonix drug-eluting balloon was then advanced across the stent inflated to 10 atm for approximately 1 minute.  Follow-up imaging now demonstrated wide patency with less than 10% residual stenosis.  There was preservation of the distal runoff.    After review of  these images the sheath is pulled into the right external iliac oblique of the common femoral is obtained and a Star close device deployed. There no immediate complications.   Findings:  The distal abdominal aorta is opacified with contrast. The distal aorta itself has minimal disease and no hemodynamically significant lesions. The common and external iliac arteries are widely patent bilaterally.  The left common  femoral is widely patent as is the profunda femoris.  The SFA does indeed have a significant stenosis at Hunter's canal it is a fairly focal lesion of greater than 80%.  The mid to distal popliteal is widely patent.  Trifurcation is patent but the anterior tibial occludes in its mid one third it does reconstitute down by the ankle via collaterals from the peroneal.  Tibioperoneal trunk peroneal and posterior tibial are all widely patent and free of hemodynamically significant stenosis with the posterior tibial being the dominant runoff filling the plantar arteries and the pedal arch.  Following angioplasty and stent placement the SFA is now patent with in-line flow and looks quite nice with less than 10% residual stenosis.  There is preservation of the distal two-vessel runoff    Summary: Successful recanalization left lower extremity for limb salvage                        Disposition: Patient was taken to the recovery room in stable condition having tolerated the procedure well.  Ernest Carpenter, Cordella MATSU 04/05/2024,8:36 PM

## 2024-04-05 NOTE — Hospital Course (Signed)
 Ernest Carpenter is a 78 y.o. male with medical history significant for coronary artery disease status post CABG and stent angioplasty, diabetes mellitus, hypertension, history of atrial fibrillation, history of PAD who presents to the emergency room from the podiatrist's office for evaluation of gangrene involving the left fifth toe.  Patient did not have fever, white count 11.3.  Creatinine 3.07, MRI of the left foot showed 5th toe osteomyelitis, soft tissue with gas. Patient initially given cefepime and vancomycin , followed by Rocephin and Flagyl.

## 2024-04-05 NOTE — Progress Notes (Signed)
  Progress Note   Patient: Ernest Carpenter FMW:[id number] DOB: 1946/09/02 DOA: 04/04/2024     1 DOS: the patient was seen and examined on 04/05/2024   Brief hospital course: DAXON KYNE is a 78 y.o. male with medical history significant for coronary artery disease status post CABG and stent angioplasty, diabetes mellitus, hypertension, history of atrial fibrillation, history of PAD who presents to the emergency room from the podiatrist's office for evaluation of gangrene involving the left fifth toe.  Patient did not have fever, white count 11.3.  Creatinine 3.07, MRI of the left foot showed 5th toe osteomyelitis, soft tissue with gas. Patient initially given cefepime and vancomycin , followed by Rocephin and Flagyl.   Principal Problem:   Gangrene of toe of left foot (HCC) Active Problems:   Diabetic foot ulcer associated with type 2 diabetes mellitus (HCC)   CAD (coronary artery disease)   Acute renal failure superimposed on stage 3b chronic kidney disease (HCC)   Essential hypertension   Diabetes mellitus without complication (HCC)   Reactive thrombocytosis   Metabolic acidosis   Assessment and Plan: * Gangrene of toe of left foot (HCC) Left fifth toe acute osteomyelitis. Diabetic foot ulcer associated with type 2 diabetes mellitus (HCC) Patient has possible peripheral arterial disease.  Patient has been seen by vascular surgery, planning for angiogram with potential intervention today.  Patient also being seen by podiatry, decision for surgery will be made after angiogram. Blood cultures so far has no growth. Current antibiotics is Rocephin and Flagyl.  Acute renal failure superimposed on stage 3b chronic kidney disease (HCC) Likely secondary to dehydration.  Continue IV fluids. Patient will have angiogram performed today, will follow-up on renal function afterwards.  Also obtained nephrology consult in case patient has worsening renal function requiring hemodialysis.  CAD  (coronary artery disease) Hold aspirin  and Plavix  for planned procedure Continue atenolol  with holding parameters and statins   Essential hypertension Continue atenolol  and amlodipine  Diabetes mellitus without complication (HCC) Continue sliding scale insulin .       Subjective:  Patient does not have significant pain due to peripheral neuropathy. Denies any chest pain shortness of breath.  Physical Exam: Vitals:   04/04/24 2003 04/05/24 0013 04/05/24 0428 04/05/24 0754  BP: 119/70 (!) 120/59 (!) 109/57 (!) 111/54  Pulse: (!) 58 62 (!) 59 61  Resp: 18 16 16 17   Temp:   97.7 F (36.5 C) 97.7 F (36.5 C)  TempSrc:    Oral  SpO2: 100% 100% 99% 96%  Weight:      Height:       General exam: Appears calm and comfortable  Respiratory system: Clear to auscultation. Respiratory effort normal. Cardiovascular system: S1 & S2 heard, RRR. No JVD, murmurs, rubs, gallops or clicks. No pedal edema. Gastrointestinal system: Abdomen is nondistended, soft and nontender. No organomegaly or masses felt. Normal bowel sounds heard. Central nervous system: Alert and oriented. No focal neurological deficits. Extremities: Symmetric 5 x 5 power. Skin: No rashes, lesions or ulcers Psychiatry: Judgement and insight appear normal. Mood & affect appropriate.    Data Reviewed:  MRI results and lab results reviewed.  Family Communication: None  Disposition: Status is: Inpatient Remains inpatient appropriate because: Severity of disease, IV treatment, inpatient procedures     Time spent: 35 minutes  Author: Murvin Mana, MD 04/05/2024 1:01 PM  For on call review [email or web address].

## 2024-04-05 NOTE — Plan of Care (Signed)
  Problem: Coping: Goal: Ability to adjust to condition or change in health will improve Outcome: Progressing   Problem: Fluid Volume: Goal: Ability to maintain a balanced intake and output will improve Outcome: Progressing   Problem: Nutritional: Goal: Maintenance of adequate nutrition will improve Outcome: Progressing   Problem: Skin Integrity: Goal: Risk for impaired skin integrity will decrease Outcome: Progressing   Problem: Education: Goal: Knowledge of General Education information will improve Description: Including pain rating scale, medication(s)/side effects and non-pharmacologic comfort measures Outcome: Progressing   Problem: Nutrition: Goal: Adequate nutrition will be maintained Outcome: Progressing   Problem: Coping: Goal: Level of anxiety will decrease Outcome: Progressing   Problem: Pain Managment: Goal: General experience of comfort will improve and/or be controlled Outcome: Progressing   Problem: Safety: Goal: Ability to remain free from injury will improve Outcome: Progressing

## 2024-04-05 NOTE — Consult Note (Signed)
 PHARMACY - ANTICOAGULATION CONSULT NOTE  Pharmacy Consult for IV Heparin Indication: Atherosclerotic disease of lower extremity s/p intervention  Patient Measurements: Height: 5' 11 (180.3 cm) Weight: 68 kg (150 lb) IBW/kg (Calculated) : 75.3 HEPARIN DW (KG): 68  Labs: Recent Labs    04/04/24 1343 04/05/24 0437  HGB 12.3* 10.2*  HCT 37.6* 31.0*  PLT 517* 434*  CREATININE 3.07* 2.70*    Estimated Creatinine Clearance: 22 mL/min (A) (by C-G formula based on SCr of 2.7 mg/dL (H)).   Medical History: Past Medical History:  Diagnosis Date   Anginal pain (HCC)    CHF (congestive heart failure) (HCC)    Coronary artery disease    Diabetes mellitus without complication (HCC)    Hypertension    Myocardial infarction (HCC)    Neuromuscular disorder (HCC)     Medications:  No anticoagulation prior to admission ASA 81 mg at home  Assessment: 78 y/o M with medical history as above here with left fifth toe acute OM and gangrene complicated by lower extremity atherosclerotic disease s/p endovascular intervention. Pharmacy consulted to initiate and manage heparin. Tentative plan is for foot surgery on 04/08/24.  Labs reviewed  Goal of Therapy:  Heparin level 0.3-0.7 units/ml Monitor platelets by anticoagulation protocol: Yes   Plan:  --Heparin 4000 unit IV bolus followed by continuous infusion at 800 units/hr --Heparin level 8 hours from start --Daily CBC per protocol while on IV heparin  Ernest Carpenter 04/05/2024,8:38 PM

## 2024-04-05 NOTE — Plan of Care (Signed)
   Problem: Health Behavior/Discharge Planning: Goal: Ability to manage health-related needs will improve Outcome: Progressing   Problem: Clinical Measurements: Goal: Ability to maintain clinical measurements within normal limits will improve Outcome: Progressing Goal: Will remain free from infection Outcome: Progressing

## 2024-04-05 NOTE — Interval H&P Note (Signed)
 History and Physical Interval Note:  04/05/2024 3:09 PM  Ernest Carpenter  has presented today for surgery, with the diagnosis of GAngrene to left foot/ 5th toe.  The various methods of treatment have been discussed with the patient and family. After consideration of risks, benefits and other options for treatment, the patient has consented to  Procedure(s): Lower Extremity Angiography (Left) as a surgical intervention.  The patient's history has been reviewed, patient examined, no change in status, stable for surgery.  I have reviewed the patient's chart and labs.  Questions were answered to the patient's satisfaction.     Cordella Shawl

## 2024-04-06 ENCOUNTER — Encounter: Payer: Self-pay | Admitting: Vascular Surgery

## 2024-04-06 DIAGNOSIS — Z9889 Other specified postprocedural states: Secondary | ICD-10-CM | POA: Diagnosis not present

## 2024-04-06 DIAGNOSIS — I96 Gangrene, not elsewhere classified: Secondary | ICD-10-CM | POA: Diagnosis not present

## 2024-04-06 DIAGNOSIS — Z95828 Presence of other vascular implants and grafts: Secondary | ICD-10-CM | POA: Diagnosis not present

## 2024-04-06 DIAGNOSIS — I70262 Atherosclerosis of native arteries of extremities with gangrene, left leg: Secondary | ICD-10-CM | POA: Diagnosis not present

## 2024-04-06 LAB — CBC
HCT: 36.3 % — ABNORMAL LOW (ref 39.0–52.0)
Hemoglobin: 11.9 g/dL — ABNORMAL LOW (ref 13.0–17.0)
MCH: 26.5 pg (ref 26.0–34.0)
MCHC: 32.8 g/dL (ref 30.0–36.0)
MCV: 80.8 fL (ref 80.0–100.0)
Platelets: 509 10*3/uL — ABNORMAL HIGH (ref 150–400)
RBC: 4.49 MIL/uL (ref 4.22–5.81)
RDW: 14.2 % (ref 11.5–15.5)
WBC: 14.3 10*3/uL — ABNORMAL HIGH (ref 4.0–10.5)
nRBC: 0 % (ref 0.0–0.2)

## 2024-04-06 LAB — BASIC METABOLIC PANEL WITH GFR
Anion gap: 9 (ref 5–15)
BUN: 43 mg/dL — ABNORMAL HIGH (ref 8–23)
CO2: 19 mmol/L — ABNORMAL LOW (ref 22–32)
Calcium: 8.2 mg/dL — ABNORMAL LOW (ref 8.9–10.3)
Chloride: 108 mmol/L (ref 98–111)
Creatinine, Ser: 2.77 mg/dL — ABNORMAL HIGH (ref 0.61–1.24)
GFR, Estimated: 23 mL/min — ABNORMAL LOW (ref >60)
Glucose, Bld: 200 mg/dL — ABNORMAL HIGH (ref 70–99)
Potassium: 3.9 mmol/L (ref 3.5–5.1)
Sodium: 136 mmol/L (ref 135–145)

## 2024-04-06 LAB — GLUCOSE, CAPILLARY
Glucose-Capillary: 165 mg/dL — ABNORMAL HIGH (ref 70–99)
Glucose-Capillary: 245 mg/dL — ABNORMAL HIGH (ref 70–99)
Glucose-Capillary: 179 mg/dL — ABNORMAL HIGH (ref 70–99)
Glucose-Capillary: 133 mg/dL — ABNORMAL HIGH (ref 70–99)

## 2024-04-06 LAB — MAGNESIUM: Magnesium: 1.9 mg/dL (ref 1.7–2.4)

## 2024-04-06 LAB — HEPARIN LEVEL (UNFRACTIONATED)
Heparin Unfractionated: <0.1 [IU]/mL — ABNORMAL LOW (ref 0.30–0.70)
Heparin Unfractionated: <0.1 [IU]/mL — ABNORMAL LOW (ref 0.30–0.70)

## 2024-04-06 MED ORDER — HEPARIN BOLUS VIA INFUSION
2000.0000 [IU] | Freq: Once | INTRAVENOUS | Status: AC
  Administered 2024-04-06: 2000 [IU] via INTRAVENOUS
  Filled 2024-04-06: qty 2000

## 2024-04-06 MED ORDER — HEPARIN (PORCINE) 25000 UT/250ML-% IV SOLN
1300.0000 [IU]/h | INTRAVENOUS | Status: AC
  Administered 2024-04-06: 1050 [IU]/h via INTRAVENOUS
  Administered 2024-04-06: 1200 [IU]/h via INTRAVENOUS
  Administered 2024-04-07: 1300 [IU]/h via INTRAVENOUS
  Filled 2024-04-06 (×2): qty 250

## 2024-04-06 MED ORDER — SODIUM CHLORIDE 0.9 % IV SOLN: INTRAVENOUS | Status: AC

## 2024-04-06 MED ORDER — INSULIN GLARGINE-YFGN 100 UNIT/ML ~~LOC~~ SOLN
5.0000 [IU] | Freq: Every day | SUBCUTANEOUS | Status: DC
  Administered 2024-04-06: 5 [IU] via SUBCUTANEOUS
  Filled 2024-04-06 (×2): qty 0.05

## 2024-04-06 NOTE — TOC Initial Note (Signed)
 Transition of Care [facility]) - Initial/Assessment Note    Patient Details  Name: Ernest Carpenter MRN: [id number] Date of Birth: 1946-09-23  [facility] of [facility]) CM/SW Contact:    Quintella Suzen Jansky, RN Phone Number: 04/06/2024, 3:23 PM  Clinical Narrative:                 Patient scheduled for surgical procedure with podiatry on 04/08/2024. TOC will monitor for any potential discharge needs.          Patient Goals and CMS Choice            Expected Discharge Plan and Services                                              Prior Living Arrangements/Services                       Activities of Daily Living   ADL Screening (condition at time of admission) Independently performs ADLs?: Yes (appropriate for developmental age) Is the patient deaf or have difficulty hearing?: No Does the patient have difficulty seeing, even when wearing glasses/contacts?: No Does the patient have difficulty concentrating, remembering, or making decisions?: No  Permission Sought/Granted                  Emotional Assessment              Admission diagnosis:  AKI (acute kidney injury) (HCC) [N17.9] Gangrene of toe of left foot (HCC) [I96] Patient Active Problem List   Diagnosis Date Noted   Reactive thrombocytosis 04/05/2024   Metabolic acidosis 04/05/2024   Acute osteomyelitis of left foot (HCC) 04/05/2024   Gangrene of toe of left foot (HCC) 04/04/2024   Cellulitis of fifth toe of left foot 04/04/2024   Essential hypertension 04/04/2024   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 [place] [address] Phone: [phone] Fax: [phone]  [facility] - [place], [place] - [address] [place] [address] Phone: [phone] Fax: [phone]     Social Drivers of Health (SDOH) Social History: SDOH Screenings   Food Insecurity: No Food Insecurity (04/04/2024)  Recent Concern: Food Insecurity - Food Insecurity Present (02/24/2024)   Received from [facility]  Housing: Low Risk  (04/04/2024)  Transportation Needs: No Transportation Needs (04/04/2024)  Utilities: Not At Risk (04/04/2024)  Financial Resource Strain: Low Risk  (02/24/2024)   Received from [facility]  Physical Activity: Insufficiently Active (10/21/2017)   Received from [facility]  Social Connections: Moderately Integrated (10/21/2017)   Received from [facility]  Stress: No Stress Concern Present (10/21/2017)   Received from [facility]  Tobacco Use: Medium Risk (04/04/2024)   SDOH Interventions:     Readmission Risk Interventions     No data to display

## 2024-04-06 NOTE — Progress Notes (Signed)
 [facility]  ROUNDING NOTE   Subjective:  [name] is a 78 y.o.  male with past medical conditions including CAD status post CABG with stent angioplasty, hypertension, diabetes, atrial fibrillation, PAD and chronic kidney disease stage IIIa, who was admitted to [facility] on 04/04/2024 for AKI (acute kidney injury) (HCC) [N17.9] Gangrene of toe of left foot (HCC) [I96]. Update: No acute events overnight. Patient in bed resting, wife and family at bedside. No complaints to offer. Encouraged fluid intake.   Objective:  Vital signs in last 24 hours:  Temp:  [97.5 F (36.4 C)-98.1 F (36.7 C)] 97.5 F (36.4 C) (07/02 1548) Pulse Rate:  [65-74] 74 (07/02 1548) Resp:  [16-18] 16 (07/02 1548) BP: (108-128)/(53-63) 117/57 (07/02 1548) SpO2:  [98 %-100 %] 99 % (07/02 1548)  Weight change:  Filed Weights   04/04/24 1339  Weight: 68 kg    Intake/Output: I/O last 3 completed shifts: In: 1915.8 [P.O.:360; I.V.:670.1; IV Piggyback:885.7] Out: 400 [Urine:400]   Intake/Output this shift:  Total I/O In: 625.8 [P.O.:120; I.V.:105.8; IV Piggyback:400] Out: -   Physical Exam: General: NAD,   Head: Normocephalic  Eyes: Anicteric  Neck: Supple  Lungs:  Clear to auscultation, on room air  Heart: Regular rate and rhythm  Abdomen:  Soft, nontender,   Extremities: No peripheral edema.  Neurologic: Alert  Skin: No lesions  Access: None    Basic Metabolic Panel: Recent Labs  Lab 04/04/24 1343 04/05/24 0437 04/06/24 0518  NA 136 137 136  K 4.3 4.0 3.9  CL 104 108 108  CO2 21* 21* 19*  GLUCOSE 155* 89 200*  BUN 39* 38* 43*  CREATININE 3.07* 2.70* 2.77*  CALCIUM  8.6* 8.1* 8.2*  MG  --   --  1.9    Liver Function Tests: Recent Labs  Lab 04/04/24 1343  AST 51*  ALT 46*  ALKPHOS 116  BILITOT 1.1  PROT 7.4  ALBUMIN 2.7*   No results for input(s): LIPASE, AMYLASE in the last 168 hours. No results for input(s): AMMONIA in the last 168  hours.  CBC: Recent Labs  Lab 04/04/24 1343 04/05/24 0437 04/06/24 0518  WBC 11.3* 9.8 14.3*  NEUTROABS 7.7  --   --   HGB 12.3* 10.2* 11.9*  HCT 37.6* 31.0* 36.3*  MCV 82.1 81.8 80.8  PLT 517* 434* 509*    Cardiac Enzymes: No results for input(s): CKTOTAL, CKMB, CKMBINDEX, TROPONINI in the last 168 hours.  BNP: Invalid input(s): POCBNP  CBG: Recent Labs  Lab 04/05/24 1706 04/05/24 2129 04/06/24 0811 04/06/24 1136 04/06/24 1716  GLUCAP 108* 246* 165* 245* 179*    Microbiology: Results for orders placed or performed during the hospital encounter of 04/04/24  Blood culture (single)     Status: None (Preliminary result)   Collection Time: 04/04/24  1:43 PM   Specimen: BLOOD  Result Value Ref Range Status   Specimen Description BLOOD RIGHT ANTECUBITAL  Final   Special Requests   Final    BOTTLES DRAWN AEROBIC AND ANAEROBIC Blood Culture results may not be optimal due to an inadequate volume of blood received in culture bottles   Culture   Final    NO GROWTH 2 DAYS Performed at [facility], [address], [place], [place] [address]    Report Status PENDING  Incomplete  Blood culture (single)     Status: None (Preliminary result)   Collection Time: 04/04/24  2:20 PM   Specimen: BLOOD  Result Value Ref Range  Status   Specimen Description BLOOD RIGHT ANTECUBITAL  Final   Special Requests   Final    BOTTLES DRAWN AEROBIC AND ANAEROBIC Blood Culture results may not be optimal due to an inadequate volume of blood received in culture bottles   Culture   Final    NO GROWTH 2 DAYS Performed at [facility], [address], [place], [place] [address]    Report Status PENDING  Incomplete    Coagulation Studies: No results for input(s): LABPROT, INR in the last 72 hours.  Urinalysis: No results for input(s): COLORURINE, LABSPEC, PHURINE, GLUCOSEU, HGBUR, BILIRUBINUR, KETONESUR, PROTEINUR, UROBILINOGEN, NITRITE,  LEUKOCYTESUR in the last 72 hours.  Invalid input(s): APPERANCEUR    Imaging: PERIPHERAL VASCULAR CATHETERIZATION Result Date: 04/05/2024 See surgical note for result.  US  RENAL Result Date: 04/05/2024 CLINICAL DATA:  [id number] Acute kidney failure (HCC) [id number] EXAM: RENAL / URINARY TRACT ULTRASOUND COMPLETE COMPARISON:  None Available. FINDINGS: Right Kidney: Renal measurements: 10.3 x 5.3 x 6.5 cm = volume: 187 mL. Mildly increased echogenicity. A couple of small cysts present measuring up to 1.3 cm. No hydronephrosis or nephrolithiasis. Left Kidney: Renal measurements: 11.2 x 5.2 x 4.9 cm = volume: 150 mL. Mildly increased echogenicity. Multiple renal cysts, the largest measures 7.8 x 8 x 7.2 cm. No hydronephrosis or nephrolithiasis. Bladder: Appears normal for degree of bladder distention. Other: None. IMPRESSION: 1. No hydronephrosis or nephrolithiasis. 2. Renal parenchymal changes, which can be seen in both acute and chronic medical renal disease. Electronically Signed   By: Rogelia Myers M.D.   On: 04/05/2024 12:37   MR FOOT LEFT W WO CONTRAST Result Date: 04/04/2024 EXAM DESCRIPTION: MR FOOT LEFT W WO CONTRAST CLINICAL HISTORY: Soft tissue infection suspected, foot, xray done COMPARISON: None Available. TECHNIQUE: MRI of the foot is performed according to our usual protocol with multiplanar multi sequence imaging. FINDINGS: There is a large soft tissue wound/ulceration about the distal fourth and fifth digits. Phlegmonous changes in the soft tissues without drainable collection. There is severe marrow edema to the fifth metatarsal head extending proximally. Areas of cortical destruction to the metatarsal head as well as the proximal and middle phalanx. Findings consistent with osteomyelitis. Suggestion of mild gas in the soft tissues surrounding the fifth metatarsal head. Correlate with x-rays. There is moderate to severe marrow edema on either side of the fourth MTP joint. Associated joint  effusion. No definitive cortical erosion/destruction. Findings are suspicious for early osteomyelitis and septic arthritis. The marrow signal is otherwise unremarkable. Moderate atrophy to the plantar musculature. The tendons are unremarkable. Moderate dorsal subcutaneous edema. IMPRESSION: Large soft tissue wound seen about the distal fourth and fifth digits. Phlegmonous changes without drainable collection. Suspicion of mild gas in the soft tissues about the head of the fifth metatarsal. Correlate with x-rays. Severe marrow edema to the fifth metatarsal head as well as cortical destruction. Probable cortical destruction to the more distal fifth digit as well. Again correlate with x-rays. 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 Moderate to severe marrow edema on either side of the fourth MTP joint with associated moderate joint effusion. This is probably septic arthritis/osteomyelitis. Moderate dorsal subcutaneous edema suggesting cellulitis. Electronically signed by: Reyes Frees MD 04/04/2024 10:51 PM EDT RP Workstation: [id number]     Medications:    sodium chloride  Stopped (04/06/24 1157)   sodium chloride  100 mL/hr at 04/06/24 1600   cefTRIAXone (ROCEPHIN)  IV Stopped (04/06/24 0941)   heparin Stopped (04/06/24 0902)   linezolid (ZYVOX) IV Stopped (04/06/24 1110)  amLODipine  10 mg Oral Daily   aspirin  EC  81 mg Oral Daily   gabapentin   300 mg Oral TID   insulin  aspart  0-15 Units Subcutaneous TID WC   insulin  glargine-yfgn  5 Units Subcutaneous QHS   metroNIDAZOLE  500 mg Oral Q12H   pantoprazole   40 mg Oral BID   rosuvastatin  40 mg Oral QHS   sodium chloride  flush  3 mL Intravenous Q12H   sodium chloride , HYDROcodone-acetaminophen , ondansetron  **OR** ondansetron  (ZOFRAN ) IV, sodium chloride  flush  Assessment/ Plan:  [name] is a 78 y.o.  male with past medical conditions including CAD status post CABG with stent angioplasty, hypertension, diabetes, atrial  fibrillation, PAD and chronic kidney disease stage IIIa, who was admitted to [facility] on 04/04/2024 for AKI (acute kidney injury) (HCC) [N17.9] Gangrene of toe of left foot (HCC) [I96]   1.  Acute kidney injury on chronic kidney disease stage III A.  Baseline creatinine appears to be 1.38 with GFR 53 on February 03, 2024.  Acute kidney injury likely secondary to poor oral intake, diuretic/ACE use, and infection.  Renal ultrasound negative for obstruction.  Diuretics and ACE inhibitor's currently held.  No acute indication for dialysis.  Continue supportive measures, IV hydration stopped.  Patient encouraged to maintain oral intake. Monitoring renal indices. Recommend 2-3 week follow-up with our office post discharge. Lab Results  Component Value Date   CREATININE 2.77 (H) 04/06/2024   CREATININE 2.70 (H) 04/05/2024   CREATININE 3.07 (H) 04/04/2024     Intake/Output Summary (Last 24 hours) at 04/06/2024 1819 Last data filed at 04/06/2024 1600 Gross per 24 hour  Intake 1436.01 ml  Output --  Net 1436.01 ml      2. Diabetes mellitus type II with chronic kidney disease/renal manifestations:noninsulin dependent. Home regimen includes metformin. Most recent hemoglobin A1c is 7.0 on 04/04/24.    3.  Hypertension with chronic kidney disease.  Home regimen includes lisinopril  and HCTZ.  All currently held at this time. BP 117/57   4. Anemia of chronic kidney disease.  Normocytic Recent Labs       Lab Results  Component Value Date    HGB 11.9 (L) 04/06/2024    Hemoglobin acceptable at this time.   LOS: 2 Kelani Robart SHAUNNA Dines 7/2/20256:01 PM

## 2024-04-06 NOTE — Consult Note (Signed)
 PHARMACY - ANTICOAGULATION CONSULT NOTE  Pharmacy Consult for IV Heparin Indication: Atherosclerotic disease of lower extremity s/p intervention  Patient Measurements: Height: 5' 11 (180.3 cm) Weight: 68 kg (150 lb) IBW/kg (Calculated) : 75.3 HEPARIN DW (KG): 68  Labs: Recent Labs    04/04/24 1343 04/05/24 0437 04/06/24 0518  HGB 12.3* 10.2* 11.9*  HCT 37.6* 31.0* 36.3*  PLT 517* 434* 509*  HEPARINUNFRC  --   --  <0.10*  CREATININE 3.07* 2.70* 2.77*    Estimated Creatinine Clearance: 21.5 mL/min (A) (by C-G formula based on SCr of 2.77 mg/dL (H)).   Medical History: Past Medical History:  Diagnosis Date   Anginal pain (HCC)    CHF (congestive heart failure) (HCC)    Coronary artery disease    Diabetes mellitus without complication (HCC)    Hypertension    Myocardial infarction (HCC)    Neuromuscular disorder (HCC)     Medications:  No anticoagulation prior to admission ASA 81 mg at home  Assessment: 78 y/o M with medical history as above here with left fifth toe acute OM and gangrene complicated by lower extremity atherosclerotic disease s/p endovascular intervention. Pharmacy consulted to initiate and manage heparin. Tentative plan is for foot surgery on 04/08/24.  Labs reviewed  Goal of Therapy:  Heparin level 0.3-0.7 units/ml Monitor platelets by anticoagulation protocol: Yes   Plan:  7/2:  HL @ 0518 = < 0.10, SUBtherapeutic - Will order heparin 2000 units IV X 1 bolus and increase drip rate to 1050 units/hr - Will recheck HL 8 hrs after rate change --Daily CBC per protocol while on IV heparin  Dudley Mages D 04/06/2024,6:22 AM

## 2024-04-06 NOTE — Consult Note (Signed)
 PHARMACY - ANTICOAGULATION CONSULT NOTE  Pharmacy Consult for IV Heparin Indication: Atherosclerotic disease of lower extremity s/p intervention  Patient Measurements: Height: 5' 11 (180.3 cm) Weight: 68 kg (150 lb) IBW/kg (Calculated) : 75.3 HEPARIN DW (KG): 68  Labs: Recent Labs    04/04/24 1343 04/05/24 0437 04/06/24 0518 04/06/24 1516  HGB 12.3* 10.2* 11.9*  --   HCT 37.6* 31.0* 36.3*  --   PLT 517* 434* 509*  --   HEPARINUNFRC  --   --  <0.10* <0.10*  CREATININE 3.07* 2.70* 2.77*  --     Estimated Creatinine Clearance: 21.5 mL/min (A) (by C-G formula based on SCr of 2.77 mg/dL (H)).   Medical History: Past Medical History:  Diagnosis Date   Anginal pain (HCC)    CHF (congestive heart failure) (HCC)    Coronary artery disease    Diabetes mellitus without complication (HCC)    Hypertension    Myocardial infarction (HCC)    Neuromuscular disorder (HCC)     Medications:  No anticoagulation prior to admission ASA 81 mg at home  Assessment: 78 y/o M with medical history as above here with left fifth toe acute OM and gangrene complicated by lower extremity atherosclerotic disease s/p endovascular intervention. Pharmacy consulted to initiate and manage heparin. Tentative plan is for foot surgery on 04/08/24.  Labs reviewed  Goal of Therapy:  Heparin level 0.3-0.7 units/ml Monitor platelets by anticoagulation protocol: Yes    7/2 0518 HL <0.10 7/2 1516 HL <0.10  Plan:  HL subtherapeutic Give heparin 2000 units IV x 1 Increase heparin infusion to 1200 units/hr Recheck heparin level 8 hours after rate change CBC daily while on heparin  Kayla JULIANNA Blew, PharmD 04/06/2024,5:59 PM

## 2024-04-06 NOTE — Progress Notes (Signed)
 Progress Note    04/06/2024 10:04 AM 1 Day Post-Op  Subjective:  Ernest Carpenter is a 78 year old male now POD #1 from:  Procedure(s) Performed:             1.  Introduction catheter into left lower extremity 3rd order catheter placement              2.    Contrast injection left lower extremity for distal runoff             3.  Percutaneous transluminal angioplasty and stent placement superficial femoral artery.             4.  Star close closure right common femoral arteriotomy  Patient is resting this morning.  Patient endorses his left leg feels better this morning.  He also endorses it feels warmer today.  No complaints overnight vitals are remained stable.  Vitals:   04/06/24 0247 04/06/24 0817  BP: (!) 108/57 128/63  Pulse: 65 73  Resp: 17 18  Temp: 98.1 F (36.7 C) 97.6 F (36.4 C)  SpO2: 100% 99%   Physical Exam: Cardiac:  RRR, normal S1 and S2.  No rubs clicks gallops or murmurs. Lungs: Nonlabored breathing, lungs clear throughout on auscultation.  No rales rhonchi or wheezing Incisions: Right groin puncture site with dressing clean dry and intact.  No hematoma seroma noted. Extremities: Bilateral lower extremities warm to touch.  Left lower extremity with positive Doppler PT/DP pulses this morning.  Right lower extremity warm with palpable pulses throughout. Abdomen: Positive bowel sounds throughout, soft, nontender nondistended. Neurologic: Alert and oriented x 3, answers all questions and follows commands appropriately.  CBC    Component Value Date/Time   WBC 14.3 (H) 04/06/2024 0518   RBC 4.49 04/06/2024 0518   HGB 11.9 (L) 04/06/2024 0518   HGB 12.4 (L) 10/09/2013 0553   HCT 36.3 (L) 04/06/2024 0518   HCT 37.2 (L) 10/09/2013 0553   PLT 509 (H) 04/06/2024 0518   PLT 248 10/09/2013 0553   MCV 80.8 04/06/2024 0518   MCV 83 10/09/2013 0553   MCH 26.5 04/06/2024 0518   MCHC 32.8 04/06/2024 0518   RDW 14.2 04/06/2024 0518   RDW 15.4 (H) 10/09/2013 0553    LYMPHSABS 2.0 04/04/2024 1343   LYMPHSABS 3.2 10/09/2013 0553   MONOABS 1.2 (H) 04/04/2024 1343   MONOABS 1.1 (H) 10/09/2013 0553   EOSABS 0.3 04/04/2024 1343   EOSABS 0.3 10/09/2013 0553   BASOSABS 0.1 04/04/2024 1343   BASOSABS 0.1 10/09/2013 0553    BMET    Component Value Date/Time   NA 136 04/06/2024 0518   NA 138 10/09/2013 0553   K 3.9 04/06/2024 0518   K 3.9 10/09/2013 0553   CL 108 04/06/2024 0518   CL 103 10/09/2013 0553   CO2 19 (L) 04/06/2024 0518   CO2 30 10/09/2013 0553   GLUCOSE 200 (H) 04/06/2024 0518   GLUCOSE 137 (H) 10/09/2013 0553   BUN 43 (H) 04/06/2024 0518   BUN 23 (H) 10/09/2013 0553   CREATININE 2.77 (H) 04/06/2024 0518   CREATININE 1.18 10/09/2013 0553   CALCIUM  8.2 (L) 04/06/2024 0518   CALCIUM  8.9 10/09/2013 0553   GFRNONAA 23 (L) 04/06/2024 0518   GFRNONAA >60 10/09/2013 0553   GFRAA >60 06/07/2017 0557   GFRAA >60 10/09/2013 0553    INR    Component Value Date/Time   INR 1.1 10/07/2013 0324     Intake/Output Summary (Last 24 hours) at 04/06/2024  1004 Last data filed at 04/06/2024 0500 Gross per 24 hour  Intake 930.24 ml  Output 400 ml  Net 530.24 ml     Assessment/Plan:  78 y.o. male is s/p SEE ABOVE 1 Day Post-Op   PLAN Patient now postop day 1 from transluminal angioplasty and stent placement of his left superficial femoral artery.  Patient's lower extremity is warm today with palpable pulses. Podiatry's plan is to go back to the operating room on Friday for possible transmetatarsal amputation with removal of gangrenous toe. Patient remains on heparin infusion until surgery.  Postsurgery patient will need to be on aspirin  81 mg daily and Plavix  75 mg daily with a statin. Patient to follow-up with vein and vascular surgery in 6 weeks with ultrasounds and clinic.  DVT prophylaxis: Heparin infusion   Gwendlyn JONELLE Shank [facility] 04/06/2024 10:04 AM

## 2024-04-06 NOTE — Progress Notes (Addendum)
  Progress Note   Patient: Ernest Carpenter FMW:[id number] DOB: 03-28-1946 DOA: 04/04/2024     2 DOS: the patient was seen and examined on 04/06/2024   Brief hospital course:  Ernest Carpenter is a 78 y.o. male with medical history significant for coronary artery disease status post CABG and stent angioplasty, diabetes mellitus, hypertension, history of atrial fibrillation, history of PAD who presents to the emergency room from the podiatrist's office for evaluation of gangrene involving the left fifth toe.  Patient did not have fever, white count 11.3.  Creatinine 3.07, MRI of the left foot showed 5th toe osteomyelitis, soft tissue with gas. Patient initially given cefepime and vancomycin , followed by Rocephin and Flagyl.   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  Angiogram with angiplasty and stent to left superficial femoral artery performed on 7/1. Plan for amputation with podiatry on 7/4 Blood cultures so far has no growth. Continue linezolid/ceftriaxone/flagyl Continuing heparin/asa, will need asa/plavix  after surgery  Acute renal failure superimposed on stage 3b chronic kidney disease (HCC) Likely secondary to dehydration.  Renal u/s no obstruction. Nephrology following Will resume fluids  CAD (coronary artery disease) History CABG 2023 asymptomatic Continue atenolol  with holding parameters and  statins Heparin and antiplatelets as above  Essential hypertension Well controlled Continue atenolol  and amlodipine  Diabetes mellitus without complication (HCC) Mild elevation Continue sliding scale insulin . Add semglee 5       Subjective:  No pain or fevers, tolerating diet  Physical Exam: Vitals:   04/05/24 1958 04/06/24 0247 04/06/24 0817 04/06/24 1206  BP: (!) 111/53 (!) 108/57 128/63 (!) 117/56  Pulse: 71 65 73 71  Resp: 17 17 18 17   Temp: 97.6 F (36.4 C) 98.1 F (36.7 C) 97.6 F (36.4 C) (!) 97.5 F (36.4 C)  TempSrc:   Oral Oral  SpO2: 98% 100% 99% 99%  Weight:      Height:       General exam: Appears calm and comfortable  Respiratory system: Clear to auscultation. Respiratory effort normal. Cardiovascular system: S1 & S2 heard, RRR. Soft systolic murmur Gastrointestinal system: Abdomen is nondistended, soft and nontender. No organomegaly or masses felt. N  Central nervous system: Alert and oriented. No focal neurological deficits. Extremities: Symmetric 5 x 5 power. Skin: dressing over left foot, mild pedal edema Psychiatry: Judgement and insight appear normal. Mood & affect appropriate.    Data Reviewed:  MRI results and lab results reviewed.  Family Communication: sister at bedside 7/2  Disposition: Status is: Inpatient Remains inpatient appropriate because: Severity of disease, IV treatment, inpatient procedures      Author: Devaughn KATHEE Ban, MD 04/06/2024 2:10 PM  For on call review [email or web address].

## 2024-04-07 DIAGNOSIS — I96 Gangrene, not elsewhere classified: Secondary | ICD-10-CM | POA: Diagnosis not present

## 2024-04-07 LAB — CBC
HCT: 30 % — ABNORMAL LOW (ref 39.0–52.0)
Hemoglobin: 10 g/dL — ABNORMAL LOW (ref 13.0–17.0)
MCH: 27 pg (ref 26.0–34.0)
MCHC: 33.3 g/dL (ref 30.0–36.0)
MCV: 80.9 fL (ref 80.0–100.0)
Platelets: 479 10*3/uL — ABNORMAL HIGH (ref 150–400)
RBC: 3.71 MIL/uL — ABNORMAL LOW (ref 4.22–5.81)
RDW: 14.3 % (ref 11.5–15.5)
WBC: 22.2 10*3/uL — ABNORMAL HIGH (ref 4.0–10.5)
nRBC: 0 % (ref 0.0–0.2)

## 2024-04-07 LAB — GLUCOSE, CAPILLARY
Glucose-Capillary: 92 mg/dL (ref 70–99)
Glucose-Capillary: 144 mg/dL — ABNORMAL HIGH (ref 70–99)
Glucose-Capillary: 176 mg/dL — ABNORMAL HIGH (ref 70–99)
Glucose-Capillary: 97 mg/dL (ref 70–99)

## 2024-04-07 LAB — BASIC METABOLIC PANEL WITH GFR
Anion gap: 9 (ref 5–15)
BUN: 47 mg/dL — ABNORMAL HIGH (ref 8–23)
CO2: 19 mmol/L — ABNORMAL LOW (ref 22–32)
Calcium: 7.9 mg/dL — ABNORMAL LOW (ref 8.9–10.3)
Chloride: 108 mmol/L (ref 98–111)
Creatinine, Ser: 2.83 mg/dL — ABNORMAL HIGH (ref 0.61–1.24)
GFR, Estimated: 22 mL/min — ABNORMAL LOW (ref >60)
Glucose, Bld: 116 mg/dL — ABNORMAL HIGH (ref 70–99)
Potassium: 3.9 mmol/L (ref 3.5–5.1)
Sodium: 136 mmol/L (ref 135–145)

## 2024-04-07 LAB — HEPARIN LEVEL (UNFRACTIONATED)
Heparin Unfractionated: 0.2 [IU]/mL — ABNORMAL LOW (ref 0.30–0.70)
Heparin Unfractionated: 0.4 [IU]/mL (ref 0.30–0.70)
Heparin Unfractionated: 0.31 [IU]/mL (ref 0.30–0.70)

## 2024-04-07 MED ORDER — HEPARIN BOLUS VIA INFUSION
1000.0000 [IU] | Freq: Once | INTRAVENOUS | Status: AC
  Administered 2024-04-07: 1000 [IU] via INTRAVENOUS
  Filled 2024-04-07: qty 1000

## 2024-04-07 MED ORDER — POVIDONE-IODINE 10 % EX SWAB
2.0000 | Freq: Once | CUTANEOUS | Status: AC
Start: 2024-04-07 — End: 2024-04-07
  Administered 2024-04-07: 2 via TOPICAL

## 2024-04-07 MED ORDER — LINEZOLID 600 MG PO TABS: 600.0000 mg | ORAL_TABLET | Freq: Two times a day (BID) | ORAL | Status: DC

## 2024-04-07 MED ORDER — CHLORHEXIDINE GLUCONATE 4 % EX SOLN
60.0000 mL | Freq: Once | CUTANEOUS | Status: AC
  Administered 2024-04-08: 4 via TOPICAL

## 2024-04-07 MED ORDER — LINEZOLID 600 MG/300ML IV SOLN
600.0000 mg | Freq: Two times a day (BID) | INTRAVENOUS | Status: AC
  Administered 2024-04-07 – 2024-04-08 (×2): 600 mg via INTRAVENOUS
  Filled 2024-04-07 (×3): qty 300

## 2024-04-07 NOTE — Progress Notes (Signed)
 Daily Progress Note   Subjective  - 2 Days Post-Op  Gangrene left fifth toe and fifth ray status post angio  Objective Vitals:   04/06/24 1548 04/06/24 2103 04/07/24 0321 04/07/24 0746  BP: (!) 117/57 124/60 124/65 129/64  Pulse: 74 69 71 76  Resp: 16 17 18 18   Temp: (!) 97.5 F (36.4 C) 97.9 F (36.6 C) (!) 97.5 F (36.4 C) 97.7 F (36.5 C)  TempSrc: Oral  Oral Oral  SpO2: 99% 99% 96% 97%  Weight:      Height:        Physical Exam: Necrotic tissue lateral aspect of the left foot.  Patient underwent angio with successful revascularization of the left lower extremity  MRI: IMPRESSION: Large soft tissue wound seen about the distal fourth and fifth digits. Phlegmonous changes without drainable collection. Suspicion of mild gas in the soft tissues about the head of the fifth metatarsal. Correlate with x-rays.   Severe marrow edema to the fifth metatarsal head as well as cortical destruction. Probable cortical destruction to the more distal fifth digit as well. Again correlate with x-rays. 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   Moderate to severe marrow edema on either side of the fourth MTP joint with associated moderate joint effusion. This is probably septic arthritis/osteomyelitis.  Laboratory CBC    Component Value Date/Time   WBC 22.2 (H) 04/07/2024 0251   HGB 10.0 (L) 04/07/2024 0251   HGB 12.4 (L) 10/09/2013 0553   HCT 30.0 (L) 04/07/2024 0251   HCT 37.2 (L) 10/09/2013 0553   PLT 479 (H) 04/07/2024 0251   PLT 248 10/09/2013 0553    BMET    Component Value Date/Time   NA 136 04/07/2024 0251   NA 138 10/09/2013 0553   K 3.9 04/07/2024 0251   K 3.9 10/09/2013 0553   CL 108 04/07/2024 0251   CL 103 10/09/2013 0553   CO2 19 (L) 04/07/2024 0251   CO2 30 10/09/2013 0553   GLUCOSE 116 (H) 04/07/2024 0251   GLUCOSE 137 (H) 10/09/2013 0553   BUN 47 (H) 04/07/2024 0251   BUN 23 (H) 10/09/2013 0553   CREATININE 2.83 (H) 04/07/2024 0251   CREATININE 1.18  10/09/2013 0553   CALCIUM  7.9 (L) 04/07/2024 0251   CALCIUM  8.9 10/09/2013 0553   GFRNONAA 22 (L) 04/07/2024 0251   GFRNONAA >60 10/09/2013 0553   GFRAA >60 06/07/2017 0557   GFRAA >60 10/09/2013 0553    Assessment/Planning: 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  Will plan for surgical intervention for removal of the necrotic tissue to the lateral aspect of the left foot.  Will attempt salvage of the forefoot with fifth ray amputation but we discussed if there is not enough soft tissue for coverage we will have to perform transmetatarsal amputation.  Discussed this would be an intraoperative decision.  The risk benefits alternatives and complications associated with surgery were discussed with the patient phone consent has been given.  Orders have been placed for surgery tomorrow.   Ashley Soulier A  04/07/2024, 8:41 AM

## 2024-04-07 NOTE — Progress Notes (Signed)
 [facility]  ROUNDING NOTE   Subjective:  [name] is a 78 y.o.  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  Update: Dr. Ashley planning left fifth toe amputation. Renal function relatively stable with a creatinine of 2.8.  Objective:  Vital signs in last 24 hours:  Temp:  [97.5 F (36.4 C)-97.9 F (36.6 C)] 97.7 F (36.5 C) (07/03 0746) Pulse Rate:  [69-76] 76 (07/03 0746) Resp:  [16-18] 18 (07/03 0746) BP: (117-129)/(57-65) 129/64 (07/03 0746) SpO2:  [96 %-99 %] 97 % (07/03 0746)  Weight change:  Filed Weights   04/04/24 1339  Weight: 68 kg    Intake/Output: I/O last 3 completed shifts: In: 2355 [P.O.:600; I.V.:355; IV Piggyback:1400] Out: 600 [Urine:600]   Intake/Output this shift:  Total I/O In: -  Out: 750 [Urine:750]  Physical Exam: General: NAD,   Head: Normocephalic  Eyes: Anicteric  Neck: Supple  Lungs:  Clear to auscultation, on room air  Heart: Regular rate and rhythm  Abdomen:  Soft, nontender, bowel sounds present  Extremities: No peripheral edema.  Neurologic: Alert  Skin: No lesions  Access: None    Basic Metabolic Panel: Recent Labs  Lab 04/04/24 1343 04/05/24 0437 04/06/24 0518 04/07/24 0251  NA 136 137 136 136  K 4.3 4.0 3.9 3.9  CL 104 108 108 108  CO2 21* 21* 19* 19*  GLUCOSE 155* 89 200* 116*  BUN 39* 38* 43* 47*  CREATININE 3.07* 2.70* 2.77* 2.83*  CALCIUM  8.6* 8.1* 8.2* 7.9*  MG  --   --  1.9  --     Liver Function Tests: Recent Labs  Lab 04/04/24 1343  AST 51*  ALT 46*  ALKPHOS 116  BILITOT 1.1  PROT 7.4  ALBUMIN 2.7*   No results for input(s): LIPASE, AMYLASE in the last 168 hours. No results for input(s): AMMONIA in the last  168 hours.  CBC: Recent Labs  Lab 04/04/24 1343 04/05/24 0437 04/06/24 0518 04/07/24 0251  WBC 11.3* 9.8 14.3* 22.2*  NEUTROABS 7.7  --   --   --   HGB 12.3* 10.2* 11.9* 10.0*  HCT 37.6* 31.0* 36.3* 30.0*  MCV 82.1 81.8 80.8 80.9  PLT 517* 434* 509* 479*    Cardiac Enzymes: No results for input(s): CKTOTAL, CKMB, CKMBINDEX, TROPONINI in the last 168 hours.  BNP: Invalid input(s): POCBNP  CBG: Recent Labs  Lab 04/06/24 1136 04/06/24 1716 04/06/24 2101 04/07/24 0746 04/07/24 1154  GLUCAP 245* 179* 133* 92 144*    Microbiology: Results for orders placed or performed during the hospital encounter of 04/04/24  Blood culture (single)     Status: None (Preliminary result)   Collection Time: 04/04/24  1:43 PM   Specimen: BLOOD  Result Value Ref Range Status   Specimen Description BLOOD RIGHT ANTECUBITAL  Final   Special Requests   Final    BOTTLES DRAWN AEROBIC AND ANAEROBIC Blood Culture results may not be optimal due to an inadequate volume of blood received in culture bottles   Culture   Final    NO GROWTH 3 DAYS Performed at [facility], [address], [place], [place] [address]    Report Status PENDING  Incomplete  Blood culture (single)     Status: None (Preliminary  result)   Collection Time: 04/04/24  2:20 PM   Specimen: BLOOD  Result Value Ref Range Status   Specimen Description BLOOD RIGHT ANTECUBITAL  Final   Special Requests   Final    BOTTLES DRAWN AEROBIC AND ANAEROBIC Blood Culture results may not be optimal due to an inadequate volume of blood received in culture bottles   Culture   Final    NO GROWTH 3 DAYS Performed at [facility], [address] [place], [place] [address]    Report Status PENDING  Incomplete    Coagulation Studies: No results for input(s): LABPROT, INR in the last 72 hours.  Urinalysis: No results for input(s): COLORURINE, LABSPEC, PHURINE, GLUCOSEU, HGBUR, BILIRUBINUR,  KETONESUR, PROTEINUR, UROBILINOGEN, NITRITE, LEUKOCYTESUR in the last 72 hours.  Invalid input(s): APPERANCEUR    Imaging: PERIPHERAL VASCULAR CATHETERIZATION Result Date: 04/05/2024 See surgical note for result.    Medications:    sodium chloride  100 mL/hr at 04/06/24 1600   cefTRIAXone (ROCEPHIN)  IV 2 g (04/07/24 1008)   heparin 1,300 Units/hr (04/07/24 1101)   linezolid (ZYVOX) IV      amLODipine  10 mg Oral Daily   aspirin  EC  81 mg Oral Daily   gabapentin   300 mg Oral TID   insulin  aspart  0-15 Units Subcutaneous TID WC   insulin  glargine-yfgn  5 Units Subcutaneous QHS   [START ON 04/08/2024] linezolid  600 mg Oral Q12H   metroNIDAZOLE  500 mg Oral Q12H   pantoprazole   40 mg Oral BID   rosuvastatin  40 mg Oral QHS   sodium chloride  flush  3 mL Intravenous Q12H   HYDROcodone-acetaminophen , ondansetron  **OR** ondansetron  (ZOFRAN ) IV, sodium chloride  flush  Assessment/ Plan:  [name] is a 78 y.o.  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  Acute kidney injury on chronic kidney disease stage III A.  Baseline creatinine appears to be 1.38 with GFR 53 on February 03, 2024.  Acute kidney injury likely secondary to poor oral intake, diuretic/ACE use, and infection.  Renal ultrasound negative for obstruction.  Diuretics and ACE inhibitor's currently held.   Update: Creatinine currently 2.8.  Remains on IV fluid hydration.  No immediate need for dialysis but will need to continue to monitor renal parameters closely. Lab Results  Component Value Date   CREATININE 2.83 (H) 04/07/2024   CREATININE 2.77 (H) 04/06/2024   CREATININE 2.70 (H) 04/05/2024     Intake/Output Summary (Last 24 hours) at 04/07/2024  1352 Last data filed at 04/07/2024 0747 Gross per 24 hour  Intake 1122.82 ml  Output 1350 ml  Net -227.18 ml      2. Diabetes mellitus type II with chronic kidney disease/renal manifestations:noninsulin dependent. Home regimen includes metformin. Most recent hemoglobin A1c is 7.0 on 04/04/24.    3.  Hypertension with chronic kidney disease.  Home regimen includes lisinopril  and HCTZ.  All currently held at this time.  Currently on amlodipine and blood pressure has normalized to 129/64.   4. Anemia of chronic kidney disease.  Normocytic Recent Labs       Lab Results  Component Value Date    HGB 11.9 (L) 04/06/2024    Hemoglobin acceptable at this time.  No immediate need for Epogen.  LOS: 3 Eathen Budreau 7/3/20251:52 PM

## 2024-04-07 NOTE — Consult Note (Addendum)
 PHARMACY - ANTICOAGULATION CONSULT NOTE  Pharmacy Consult for IV Heparin Indication: Atherosclerotic disease of lower extremity s/p intervention  Patient Measurements: Height: 5' 11 (180.3 cm) Weight: 68 kg (150 lb) IBW/kg (Calculated) : 75.3 HEPARIN DW (KG): 68  Labs: Recent Labs    04/05/24 0437 04/06/24 0518 04/06/24 1516 04/07/24 0251  HGB 10.2* 11.9*  --  10.0*  HCT 31.0* 36.3*  --  30.0*  PLT 434* 509*  --  479*  HEPARINUNFRC  --  <0.10* <0.10* 0.20*  CREATININE 2.70* 2.77*  --  2.83*    Estimated Creatinine Clearance: 21 mL/min (A) (by C-G formula based on SCr of 2.83 mg/dL (H)).   Medical History: Past Medical History:  Diagnosis Date   Anginal pain (HCC)    CHF (congestive heart failure) (HCC)    Coronary artery disease    Diabetes mellitus without complication (HCC)    Hypertension    Myocardial infarction (HCC)    Neuromuscular disorder (HCC)     Medications:  No anticoagulation prior to admission ASA 81 mg at home  Assessment: 78 y/o M with medical history as above here with left fifth toe acute OM and gangrene complicated by lower extremity atherosclerotic disease s/p endovascular intervention. Pharmacy consulted to initiate and manage heparin. Patient will undergo surgery on 7/4/20245 for possible TMA of gangrenous toe. After the surgery, patient will start on clopidogrel  75mg  and keep taking his aspirin  81mg .  Partial baseline labs were obtained: Hgb 12.3, PLT 517  Goal of Therapy:  Heparin level 0.3-0.7 units/ml Monitor platelets by anticoagulation protocol: Yes   7/2 0518 HL <0.10 7/2 1516 HL <0.10 7/3 0251 HL 0.20 7/3 1149 HL 0.40 Therapeutic x 1  Plan:  7/3 1149 HL 0.40, therapeutic Continue heparin infusion at 1300 units/hr Recheck confirmatory heparin level in 8 hours Stop heparin drip on 7/4 @ 0400 (stop time in place) CBC daily while on heparin  Will M. Lenon, PharmD Clinical Pharmacist 04/07/2024 12:23 PM

## 2024-04-07 NOTE — Care Management Important Message (Signed)
 Important Message  Patient Details  Name: Ernest Carpenter MRN: [id number] Date of Birth: [birth year]   Important Message Given:  Yes - Medicare IM     Toni Hoffmeister W, CMA 04/07/2024, 11:18 AM

## 2024-04-07 NOTE — Progress Notes (Signed)
  Progress Note   Patient: Ernest Carpenter FMW:[id number] DOB: 1946/05/16 DOA: 04/04/2024     3 DOS: the patient was seen and examined on 04/07/2024   Brief hospital course:  Ernest Carpenter is a 78 y.o. male with medical history significant for coronary artery disease status post CABG and stent angioplasty, diabetes mellitus, hypertension, history of atrial fibrillation, history of PAD who presents to the emergency room from the podiatrist's office for evaluation of gangrene involving the left fifth toe.  Patient did not have fever, white count 11.3.  Creatinine 3.07, MRI of the left foot showed 5th toe osteomyelitis, soft tissue with gas. Patient initially given cefepime and vancomycin , followed by Rocephin and Flagyl.   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  Angiogram with angiplasty and stent to left superficial femoral artery performed on 7/1. Plan for amputation with podiatry on 7/4 Blood cultures so far has no growth. Continue linezolid/ceftriaxone/flagyl Continuing heparin/asa, will need asa/plavix  after surgery Will need pt/ot evals after surgery  Acute renal failure superimposed on stage 3b chronic kidney disease (HCC) Likely secondary to infection, home bp meds (acei, thiazide). Renal u/s no bstruction. Nephrology following Continuing gentle fluids  CAD (coronary artery disease) History  CABG 2023 asymptomatic Continue atenolol  with holding parameters and statins Heparin and antiplatelets as above  Essential hypertension Well controlled Continue atenolol  and amlodipine  Diabetes mellitus without complication (HCC) controlled Continue sliding scale insulin .      Subjective:  No pain or fevers, tolerating diet  Physical Exam: Vitals:   04/06/24 1548 04/06/24 2103 04/07/24 0321 04/07/24 0746  BP: (!) 117/57 124/60 124/65 129/64  Pulse: 74 69 71 76  Resp: 16 17 18 18   Temp: (!) 97.5 F (36.4 C) 97.9 F (36.6 C) (!) 97.5 F (36.4 C) 97.7 F (36.5 C)  TempSrc: Oral  Oral Oral  SpO2: 99% 99% 96% 97%  Weight:      Height:       General exam: Appears calm and comfortable  Respiratory system: Clear to auscultation. Respiratory effort normal. Cardiovascular system: S1 & S2 heard, RRR. Soft systolic murmur Gastrointestinal system: Abdomen is nondistended, soft and nontender.   Central nervous system: Alert and oriented. No focal neurological deficits. Extremities: Symmetric 5 x 5 power. Skin: dressing over left foot, mild pedal edema Psychiatry: Judgement and insight appear normal. Mood & affect appropriate.    Data Reviewed:  MRI results and lab results reviewed.  Family Communication: sister at bedside 7/2  Disposition: Status is: Inpatient Remains inpatient appropriate because: Severity of disease, IV treatment, inpatient procedures      Author: Devaughn KATHEE Ban, MD 04/07/2024 2:43 PM  For on call review [email or web address].

## 2024-04-07 NOTE — Consult Note (Signed)
 PHARMACY - ANTICOAGULATION CONSULT NOTE  Pharmacy Consult for IV Heparin Indication: Atherosclerotic disease of lower extremity s/p intervention  Patient Measurements: Height: 5' 11 (180.3 cm) Weight: 68 kg (150 lb) IBW/kg (Calculated) : 75.3 HEPARIN DW (KG): 68  Labs: Recent Labs    04/05/24 0437 04/06/24 0518 04/06/24 1516 04/07/24 0251  HGB 10.2* 11.9*  --  10.0*  HCT 31.0* 36.3*  --  30.0*  PLT 434* 509*  --  479*  HEPARINUNFRC  --  <0.10* <0.10* 0.20*  CREATININE 2.70* 2.77*  --  2.83*    Estimated Creatinine Clearance: 21 mL/min (A) (by C-G formula based on SCr of 2.83 mg/dL (H)).   Medical History: Past Medical History:  Diagnosis Date   Anginal pain (HCC)    CHF (congestive heart failure) (HCC)    Coronary artery disease    Diabetes mellitus without complication (HCC)    Hypertension    Myocardial infarction (HCC)    Neuromuscular disorder (HCC)     Medications:  No anticoagulation prior to admission ASA 81 mg at home  Assessment: 78 y/o M with medical history as above here with left fifth toe acute OM and gangrene complicated by lower extremity atherosclerotic disease s/p endovascular intervention. Pharmacy consulted to initiate and manage heparin. Tentative plan is for foot surgery on 04/08/24.  Labs reviewed  Goal of Therapy:  Heparin level 0.3-0.7 units/ml Monitor platelets by anticoagulation protocol: Yes    7/2 0518 HL <0.10 7/2 1516 HL <0.10 7/3 0251 HL = 0.20  Plan:  7/3:  HL @ 0251 = 0.20, SUBtherapeutic - will order heparin 1000 units IV X 1 bolus and increase drip rate to 1350 units/hr Recheck heparin level 8 hours after rate change CBC daily while on heparin  Destynee Stringfellow D, PharmD 04/07/2024,3:32 AM

## 2024-04-07 NOTE — Consult Note (Signed)
 PHARMACY - ANTICOAGULATION CONSULT NOTE  Pharmacy Consult for IV Heparin Indication: Atherosclerotic disease of lower extremity s/p intervention  Patient Measurements: Height: 5' 11 (180.3 cm) Weight: 68 kg (150 lb) IBW/kg (Calculated) : 75.3 HEPARIN DW (KG): 68  Labs: Recent Labs    04/05/24 0437 04/06/24 0518 04/06/24 1516 04/07/24 0251 04/07/24 1149 04/07/24 1947  HGB 10.2* 11.9*  --  10.0*  --   --   HCT 31.0* 36.3*  --  30.0*  --   --   PLT 434* 509*  --  479*  --   --   HEPARINUNFRC  --  <0.10*   < > 0.20* 0.40 0.31  CREATININE 2.70* 2.77*  --  2.83*  --   --    < > = values in this interval not displayed.    Estimated Creatinine Clearance: 21 mL/min (A) (by C-G formula based on SCr of 2.83 mg/dL (H)).   Medical History: Past Medical History:  Diagnosis Date   Anginal pain (HCC)    CHF (congestive heart failure) (HCC)    Coronary artery disease    Diabetes mellitus without complication (HCC)    Hypertension    Myocardial infarction (HCC)    Neuromuscular disorder (HCC)     Medications:  No anticoagulation prior to admission ASA 81 mg at home  Assessment: 78 y/o M with medical history as above here with left fifth toe acute OM and gangrene complicated by lower extremity atherosclerotic disease s/p endovascular intervention. Pharmacy consulted to initiate and manage heparin. Patient will undergo surgery on 7/4/20245 for possible TMA of gangrenous toe. After the surgery, patient will start on clopidogrel  75mg  and keep taking his aspirin  81mg .  Partial baseline labs were obtained: Hgb 12.3, PLT 517  Goal of Therapy:  Heparin level 0.3-0.7 units/ml Monitor platelets by anticoagulation protocol: Yes   7/2 0518 HL <0.10 7/2 1516 HL <0.10 7/3 0251 HL 0.20 7/3 1149 HL 0.40 Therapeutic x 1 7/3 1947 HL 0.31.  therapeutic x 2  Plan:  7/3 1947 HL 0.31.  therapeutic x 2 Continue heparin infusion at 1300 units/hr Stop heparin drip on 7/4 @ 0400 (stop time in  place) CBC daily while on heparin  Hilmar Moldovan PharmD Clinical Pharmacist 04/07/2024

## 2024-04-07 NOTE — TOC Progression Note (Signed)
 Transition of Care [facility]) - Progression Note    Patient Details  Name: JAKELL TRUSTY MRN: [id number] Date of Birth: [birth year]  Transition of [facility] CM/SW Contact  Quintella Suzen Jansky, RN Phone Number: 04/07/2024, 3:40 PM  Clinical Narrative:     S/P angiogram. Planned amputation with podiatry tomorrow.        Expected Discharge Plan and Services                                               Social Determinants of Health (SDOH) Interventions SDOH Screenings   Food Insecurity: No Food Insecurity (04/04/2024)  Recent Concern: Food Insecurity - Food Insecurity Present (02/24/2024)   Received from [facility]  Housing: Low Risk  (04/04/2024)  Transportation Needs: No Transportation Needs (04/04/2024)  Utilities: Not At Risk (04/04/2024)  Financial Resource Strain: Low Risk  (02/24/2024)   Received from [facility]  Physical Activity: Insufficiently Active (10/21/2017)   Received from [facility]  Social Connections: Moderately Integrated (10/21/2017)   Received from [facility]  Stress: No Stress Concern Present (10/21/2017)   Received from [facility]  Tobacco Use: Medium Risk (04/04/2024)    Readmission Risk Interventions     No data to display

## 2024-04-08 ENCOUNTER — Encounter: Payer: Self-pay | Admitting: Internal Medicine

## 2024-04-08 ENCOUNTER — Encounter
Admission: EM | Disposition: A | Discharge status: Home Health | Payer: Self-pay | Source: Ambulatory Visit | Attending: Obstetrics and Gynecology

## 2024-04-08 ENCOUNTER — Inpatient Hospital Stay: Payer: Self-pay | Admitting: Anesthesiology

## 2024-04-08 ENCOUNTER — Other Ambulatory Visit: Payer: Self-pay

## 2024-04-08 DIAGNOSIS — I96 Gangrene, not elsewhere classified: Secondary | ICD-10-CM | POA: Diagnosis not present

## 2024-04-08 HISTORY — PX: TRANSMETATARSAL AMPUTATION: SHX6197

## 2024-04-08 LAB — CBC
HCT: 33.5 % — ABNORMAL LOW (ref 39.0–52.0)
Hemoglobin: 10.9 g/dL — ABNORMAL LOW (ref 13.0–17.0)
MCH: 26.7 pg (ref 26.0–34.0)
MCHC: 32.5 g/dL (ref 30.0–36.0)
MCV: 81.9 fL (ref 80.0–100.0)
Platelets: 480 K/uL — ABNORMAL HIGH (ref 150–400)
RBC: 4.09 MIL/uL — ABNORMAL LOW (ref 4.22–5.81)
RDW: 15 % (ref 11.5–15.5)
WBC: 11.4 K/uL — ABNORMAL HIGH (ref 4.0–10.5)
nRBC: 0 % (ref 0.0–0.2)

## 2024-04-08 LAB — BASIC METABOLIC PANEL WITH GFR
Anion gap: 9 (ref 5–15)
BUN: 38 mg/dL — ABNORMAL HIGH (ref 8–23)
CO2: 21 mmol/L — ABNORMAL LOW (ref 22–32)
Calcium: 8.4 mg/dL — ABNORMAL LOW (ref 8.9–10.3)
Chloride: 109 mmol/L (ref 98–111)
Creatinine, Ser: 2.69 mg/dL — ABNORMAL HIGH (ref 0.61–1.24)
GFR, Estimated: 24 mL/min — ABNORMAL LOW (ref >60)
Glucose, Bld: 94 mg/dL (ref 70–99)
Potassium: 4.1 mmol/L (ref 3.5–5.1)
Sodium: 139 mmol/L (ref 135–145)

## 2024-04-08 LAB — GLUCOSE, CAPILLARY
Glucose-Capillary: 90 mg/dL (ref 70–99)
Glucose-Capillary: 124 mg/dL — ABNORMAL HIGH (ref 70–99)
Glucose-Capillary: 234 mg/dL — ABNORMAL HIGH (ref 70–99)

## 2024-04-08 SURGERY — AMPUTATION, FOOT, TRANSMETATARSAL
Anesthesia: General | Site: Foot | Laterality: Left

## 2024-04-08 MED ORDER — BUPIVACAINE HCL (PF) 0.5 % IJ SOLN
INTRAMUSCULAR | Status: AC
  Filled 2024-04-08: qty 30

## 2024-04-08 MED ORDER — LIDOCAINE HCL (PF) 1 % IJ SOLN
INTRAMUSCULAR | Status: AC
  Filled 2024-04-08: qty 30

## 2024-04-08 MED ORDER — BUPIVACAINE HCL (PF) 0.5 % IJ SOLN
INTRAMUSCULAR | Status: DC | PRN
  Administered 2024-04-08: 20 mL

## 2024-04-08 MED ORDER — PROPOFOL 1000 MG/100ML IV EMUL
INTRAVENOUS | Status: AC
  Filled 2024-04-08: qty 100

## 2024-04-08 MED ORDER — DEXAMETHASONE SODIUM PHOSPHATE 10 MG/ML IJ SOLN
INTRAMUSCULAR | Status: DC | PRN
Start: 2024-04-08 — End: 2024-04-08
  Administered 2024-04-08: 5 mg via INTRAVENOUS

## 2024-04-08 MED ORDER — LIDOCAINE-EPINEPHRINE 1 %-1:100000 IJ SOLN
INTRAMUSCULAR | Status: AC
  Filled 2024-04-08: qty 1

## 2024-04-08 MED ORDER — PHENYLEPHRINE 80 MCG/ML (10ML) SYRINGE FOR IV PUSH (FOR BLOOD PRESSURE SUPPORT)
PREFILLED_SYRINGE | INTRAVENOUS | Status: AC
  Filled 2024-04-08: qty 10

## 2024-04-08 MED ORDER — ONDANSETRON HCL 4 MG/2ML IJ SOLN
INTRAMUSCULAR | Status: DC | PRN
Start: 2024-04-08 — End: 2024-04-08
  Administered 2024-04-08: 4 mg via INTRAVENOUS

## 2024-04-08 MED ORDER — OXYCODONE HCL 5 MG/5ML PO SOLN: 5.0000 mg | Freq: Once | ORAL | Status: DC | PRN

## 2024-04-08 MED ORDER — FENTANYL CITRATE (PF) 100 MCG/2ML IJ SOLN
INTRAMUSCULAR | Status: DC | PRN
  Administered 2024-04-08: 25 ug via INTRAVENOUS

## 2024-04-08 MED ORDER — LIDOCAINE HCL (CARDIAC) PF 100 MG/5ML IV SOSY
PREFILLED_SYRINGE | INTRAVENOUS | Status: DC | PRN
  Administered 2024-04-08: 50 mg via INTRAVENOUS

## 2024-04-08 MED ORDER — OXYCODONE HCL 5 MG PO TABS: 5.0000 mg | ORAL_TABLET | Freq: Once | ORAL | Status: DC | PRN

## 2024-04-08 MED ORDER — FENTANYL CITRATE (PF) 100 MCG/2ML IJ SOLN: 25.0000 ug | INTRAMUSCULAR | Status: DC | PRN

## 2024-04-08 MED ORDER — FENTANYL CITRATE (PF) 100 MCG/2ML IJ SOLN
INTRAMUSCULAR | Status: AC
  Filled 2024-04-08: qty 2

## 2024-04-08 MED ORDER — SODIUM CHLORIDE 0.9 % IV SOLN: INTRAVENOUS | Status: DC | PRN

## 2024-04-08 MED ORDER — OXIDIZED CELLULOSE EX PADS
MEDICATED_PAD | CUTANEOUS | Status: DC | PRN
  Administered 2024-04-08: 1 via TOPICAL

## 2024-04-08 MED ORDER — AMOXICILLIN-POT CLAVULANATE 500-125 MG PO TABS
1.0000 | ORAL_TABLET | Freq: Two times a day (BID) | ORAL | Status: DC
  Administered 2024-04-08 – 2024-04-10 (×5): 1 via ORAL
  Filled 2024-04-08 (×6): qty 1

## 2024-04-08 MED ORDER — 0.9 % SODIUM CHLORIDE (POUR BTL) OPTIME
TOPICAL | Status: DC | PRN
  Administered 2024-04-08: 300 mL

## 2024-04-08 MED ORDER — MELATONIN 5 MG PO TABS
5.0000 mg | ORAL_TABLET | Freq: Every evening | ORAL | Status: DC | PRN
  Administered 2024-04-08 – 2024-04-09 (×2): 5 mg via ORAL
  Filled 2024-04-08 (×2): qty 1

## 2024-04-08 MED ORDER — SODIUM CHLORIDE 0.9 % IV SOLN: INTRAVENOUS | Status: DC

## 2024-04-08 MED ORDER — PROPOFOL 10 MG/ML IV BOLUS
INTRAVENOUS | Status: DC | PRN
  Administered 2024-04-08: 120 mg via INTRAVENOUS

## 2024-04-08 SURGICAL SUPPLY — 44 items
BAG COUNTER SPONGE SURGICOUNT (BAG) IMPLANT
BLADE OSC/SAGITTAL 5.5X25 (BLADE) ×1 IMPLANT
BLADE SURG 15 STRL LF DISP TIS (BLADE) IMPLANT
BLADE SURG SZ10 CARB STEEL (BLADE) IMPLANT
BLADE SW THK.38XMED LNG THN (BLADE) IMPLANT
BNDG COHESIVE 4X5 TAN STRL LF (GAUZE/BANDAGES/DRESSINGS) ×1 IMPLANT
BNDG ELASTIC 4X5.8 VLCR NS LF (GAUZE/BANDAGES/DRESSINGS) ×1 IMPLANT
BNDG ESMARCH 4X12 STRL LF (GAUZE/BANDAGES/DRESSINGS) ×1 IMPLANT
BNDG GAUZE DERMACEA FLUFF 4 (GAUZE/BANDAGES/DRESSINGS) ×1 IMPLANT
BNDG STRETCH 4X75 STRL LF (GAUZE/BANDAGES/DRESSINGS) ×1 IMPLANT
CNTNR URN SCR LID CUP LEK RST (MISCELLANEOUS) ×1 IMPLANT
CUFF TOURN SGL QUICK 12 (TOURNIQUET CUFF) IMPLANT
CUFF TOURN SGL QUICK 18X4 (TOURNIQUET CUFF) IMPLANT
DRAIN PENROSE 12X.25 LTX STRL (MISCELLANEOUS) IMPLANT
DURAPREP 26ML APPLICATOR (WOUND CARE) ×1 IMPLANT
ELECTRODE REM PT RTRN 9FT ADLT (ELECTROSURGICAL) ×1 IMPLANT
GAUZE SPONGE 4X4 12PLY STRL (GAUZE/BANDAGES/DRESSINGS) ×2 IMPLANT
GAUZE XEROFORM 1X8 LF (GAUZE/BANDAGES/DRESSINGS) ×1 IMPLANT
GLOVE BIO SURGEON STRL SZ7.5 (GLOVE) ×1 IMPLANT
GLOVE INDICATOR 8.0 STRL GRN (GLOVE) ×1 IMPLANT
GOWN STRL REUS W/ TWL XL LVL3 (GOWN DISPOSABLE) ×1 IMPLANT
GOWN STRL REUS W/TWL XL LVL4 (GOWN DISPOSABLE) ×1 IMPLANT
HANDLE YANKAUER SUCT BULB TIP (MISCELLANEOUS) IMPLANT
HANDPIECE VERSAJET DEBRIDEMENT (MISCELLANEOUS) IMPLANT
KIT TURNOVER KIT A (KITS) ×1 IMPLANT
LABEL OR SOLS (LABEL) IMPLANT
MANIFOLD NEPTUNE II (INSTRUMENTS) ×1 IMPLANT
NDL HYPO 25X1 1.5 SAFETY (NEEDLE) ×1 IMPLANT
NDL SAFETY ECLIPSE 18X1.5 (NEEDLE) ×1 IMPLANT
NEEDLE HYPO 25X1 1.5 SAFETY (NEEDLE) ×1 IMPLANT
NS IRRIG 500ML POUR BTL (IV SOLUTION) ×1 IMPLANT
PACK EXTREMITY ARMC (MISCELLANEOUS) ×1 IMPLANT
PAD ABD DERMACEA PRESS 5X9 (GAUZE/BANDAGES/DRESSINGS) ×2 IMPLANT
PENCIL SMOKE EVACUATOR (MISCELLANEOUS) ×1 IMPLANT
POWDER SURGICEL 3.0 GRAM (HEMOSTASIS) IMPLANT
SOLUTION PREP PVP 2OZ (MISCELLANEOUS) ×1 IMPLANT
SPONGE T-LAP 18X18 ~~LOC~~+RFID (SPONGE) ×1 IMPLANT
STAPLER SKIN PROX 35W (STAPLE) IMPLANT
STOCKINETTE M/LG 89821 (MISCELLANEOUS) ×1 IMPLANT
SUT VIC AB 2-0 SH 27XBRD (SUTURE) IMPLANT
SUTURE EHLN 3-0 FS-10 30 BLK (SUTURE) ×1 IMPLANT
SYR 10ML LL (SYRINGE) ×2 IMPLANT
TRAP FLUID SMOKE EVACUATOR (MISCELLANEOUS) ×1 IMPLANT
WATER STERILE IRR 500ML POUR (IV SOLUTION) ×1 IMPLANT

## 2024-04-08 NOTE — Anesthesia Postprocedure Evaluation (Signed)
 Anesthesia Post Note  Patient: Ernest Carpenter  Procedure(s) Performed: AMPUTATION, FOOT, TRANSMETATARSAL (Left: Foot)  Patient location during evaluation: PACU Anesthesia Type: General Level of consciousness: awake and alert Pain management: pain level controlled Vital Signs Assessment: post-procedure vital signs reviewed and stable Respiratory status: spontaneous breathing, nonlabored ventilation and respiratory function stable Cardiovascular status: blood pressure returned to baseline and stable Postop Assessment: no apparent nausea or vomiting Anesthetic complications: no   No notable events documented.   Last Vitals:  Vitals:   04/08/24 1145 04/08/24 1156  BP: 138/69 (!) 145/76  Pulse: 80 81  Resp: 13 16  Temp: (!) 36.1 C 36.4 C  SpO2: 94% 94%    Last Pain:  Vitals:   04/08/24 1156  TempSrc: Axillary  PainSc:                  Fairy POUR Aspyn Warnke

## 2024-04-08 NOTE — Op Note (Signed)
 Operative note   Surgeon:Doral Ventrella Armed forces logistics/support/administrative officer: None    Preop diagnosis: Gangrene left forefoot    Postop diagnosis: Same    Procedure: Transmetatarsal amputation left forefoot    EBL: 30 mL    Anesthesia: 30    Hemostasis: None    Specimen: Fifth ray amputation for pathology and deep wound culture with swab fifth ray    Complications: None    Operative indications:Ernest Carpenter is an 78 y.o. that presents today for surgical intervention.  The risks/benefits/alternatives/complications have been discussed and consent has been given.    Procedure:  Patient was brought into the OR and placed on the operating table in thesupine position. After anesthesia was obtained theleft lower extremity was prepped and draped in usual sterile fashion.  Attention was initially directed to the lateral aspect of the left foot where the gangrenous left fifth ray was noted.  This was excised in toto.  The fifth metatarsal osteotomy was performed at the base region.  After removal of all nonviable necrotic tissue there was no ability to perform a primary closure due to the amount of necrotic tissue needing excision.  At this time the decision to perform a transmetatarsal amputation was performed.  The dorsal and plantar skin flaps were mapped out and full-thickness flaps were created.  At approximately the midshaft level of the metatarsals osteotomies were created.  The remaining forefoot was then removed sharply.  Bleeders were Bovie cauterized.  The wound was flushed with copious amounts of irrigation.  The lateral aspect of the fifth ray site was debrided with a Versajet down to good healthy bleeding tissue.  After further cauterization of bleeding vessels and final flush closure was performed.  The area was infiltrated with Surgicel powder for further hemostasis.  A combination of Vicryl and nylon was used for closure as well as skin staples for the skin.  A bulky sterile dressing was applied to the  left foot.    Patient tolerated the procedure and anesthesia well.  Was transported from the OR to the PACU with all vital signs stable and vascular status intact. To be discharged per routine protocol.  Will follow up in approximately 1 week in the outpatient clinic.

## 2024-04-08 NOTE — Transfer of Care (Signed)
 Immediate Anesthesia Transfer of Care Note  Patient: Ernest Carpenter  Procedure(s) Performed: AMPUTATION, FOOT, TRANSMETATARSAL (Left: Foot)  Patient Location: PACU  Anesthesia Type:General  Level of Consciousness: drowsy  Airway & Oxygen Therapy: Patient Spontanous Breathing and Patient connected to face mask oxygen  Post-op Assessment: Report given to RN and Post -op Vital signs reviewed and stable  Post vital signs: Reviewed and stable  Last Vitals:  Vitals Value Taken Time  BP    Temp    Pulse    Resp    SpO2      Last Pain:  Vitals:   04/08/24 0848  TempSrc: Temporal  PainSc: 0-No pain         Complications: No notable events documented.

## 2024-04-08 NOTE — Progress Notes (Signed)
 PT Cancellation Note  Patient Details Name: MERDITH BOYD MRN: [id number] DOB: 26-Dec-1945   Cancelled Treatment:    Reason Eval/Treat Not Completed: Other (comment) PT orders received, chart reviewed. Per chart, pt awaiting amputation sx today. Will hold PT evaluation & await new post op orders.  Richerd Pinal, PT, DPT 04/08/24, 7:59 AM   Richerd CHRISTELLA Pinal 04/08/2024, 7:58 AM

## 2024-04-08 NOTE — Anesthesia Procedure Notes (Signed)
 Procedure Name: MAC Date/Time: 04/08/2024 9:51 AM  Performed by: Delores Evalene BROCKS, CRNAPre-anesthesia Checklist: Patient identified, Emergency Drugs available, Suction available and Patient being monitored Patient Re-evaluated:Patient Re-evaluated prior to induction Oxygen Delivery Method: Nasal cannula Preoxygenation: Pre-oxygenation with 100% oxygen Induction Type: IV induction Ventilation: Mask ventilation without difficulty LMA: LMA inserted LMA Size: 5.0 Tube type: Oral Number of attempts: 1 Placement Confirmation: positive ETCO2 Tube secured with: Tape Dental Injury: Teeth and Oropharynx as per pre-operative assessment

## 2024-04-08 NOTE — Progress Notes (Signed)
  Progress Note   Patient: Ernest Carpenter FMW:[id number] DOB: 1946/01/27 DOA: 04/04/2024     4 DOS: the patient was seen and examined on 04/08/2024   Brief hospital course:  Ernest Carpenter is a 78 y.o. male with medical history significant for coronary artery disease status post CABG and stent angioplasty, diabetes mellitus, hypertension, history of atrial fibrillation, history of PAD who presents to the emergency room from the podiatrist's office for evaluation of gangrene involving the left fifth toe.  Patient did not have fever, white count 11.3.  Creatinine 3.07, MRI of the left foot showed 5th toe osteomyelitis, soft tissue with gas. Patient initially given cefepime  and vancomycin , followed by Rocephin  and Flagyl .   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 Diabetic foot ulcer associated with type 2 diabetes mellitus (HCC) PAD Patient has peripheral arterial disease.  Angiogram with angiplasty and stent to left superficial femoral artery performed on 7/1. TMA performed by podiatry today Blood cultures so far has no growth. S/p linezolid /ceftriaxone /flagyl , will transition to augmentin  Continuing heparin /asa, will need asa/plavix  after surgery, podiatry thinks should be able to start that regimen tomorrow  Acute renal failure superimposed on stage 3b chronic kidney disease (HCC) Likely secondary to infection, home bp meds (acei, thiazide). Renal u/s no bstruction. Nephrology following. Kidney function mildly improved  today Continuing gentle fluids  CAD (coronary artery disease) History CABG 2023 asymptomatic Continue atenolol  with holding parameters and statins Heparin  and antiplatelets as above  Essential hypertension Well controlled Continue atenolol  and amlodipine   Diabetes mellitus without complication (HCC) controlled Continue sliding scale insulin .      Subjective:  No pain or fevers, tolerating diet, tolerated surgery  Physical Exam: Vitals:   04/08/24 1130 04/08/24 1131 04/08/24 1145 04/08/24 1156  BP: 131/68 131/68 138/69 (!) 145/76  Pulse: 75 76 80 81  Resp: 14 16 13 16   Temp:   (!) 96.9 F (36.1 C) 97.6 F (36.4 C)  TempSrc:    Axillary  SpO2: 100% 99% 94% 94%  Weight:      Height:       General exam: Appears calm and comfortable  Respiratory system: Clear to auscultation. Respiratory effort normal. Cardiovascular system: S1 & S2 heard, RRR. Soft systolic murmur Gastrointestinal system: Abdomen is nondistended, soft and nontender.   Central nervous system: Alert and oriented. No focal neurological deficits. Extremities: Symmetric 5 x 5 power. Skin: dressing over left foot, mild pedal edema Psychiatry: Judgement and insight appear normal. Mood & affect appropriate.    Data Reviewed:  MRI results and lab results reviewed.  Family Communication: sister at bedside 7/3  Disposition: Status is: Inpatient Remains inpatient appropriate because: Severity of disease, IV treatment, inpatient procedures      Author: Devaughn KATHEE Ban, MD 04/08/2024 2:45 PM  For on call review [email or web address].

## 2024-04-08 NOTE — Anesthesia Preprocedure Evaluation (Addendum)
 Anesthesia Evaluation  Patient identified by MRN, date of birth, ID band Patient awake    Reviewed: Allergy & Precautions, NPO status , Patient's Chart, lab work & pertinent test results  History of Anesthesia Complications Negative for: history of anesthetic complications  Airway Mallampati: III  TM Distance: <3 FB Neck ROM: full    Dental  (+) Upper Dentures, Lower Dentures   Pulmonary neg shortness of breath, former smoker   Pulmonary exam normal        Cardiovascular Exercise Tolerance: Good hypertension, (-) angina + CAD and + Past MI  Normal cardiovascular exam     Neuro/Psych  Neuromuscular disease  negative psych ROS   GI/Hepatic negative GI ROS, Neg liver ROS,neg GERD  ,,  Endo/Other  diabetes, Type 2    Renal/GU CRFRenal disease  negative genitourinary   Musculoskeletal   Abdominal   Peds  Hematology negative hematology ROS (+)   Anesthesia Other Findings Past Medical History: No date: Anginal pain (HCC) No date: CHF (congestive heart failure) (HCC) No date: Coronary artery disease No date: Diabetes mellitus without complication (HCC) No date: Hypertension No date: Myocardial infarction (HCC) No date: Neuromuscular disorder [facility])  Past Surgical History: 05/12/2023: BALLOON DILATION     Comment:  Procedure: BALLOON DILATION;  Surgeon: Maryruth Ole DASEN, MD;  Location: [facility] ENDOSCOPY;  Service: Endoscopy;; No date: cardiac stents     Comment:  x3 02/13/2015: COLONOSCOPY; N/A     Comment:  Procedure: COLONOSCOPY;  Surgeon: Gladis RAYMOND Mariner, MD;              Location: [facility] ENDOSCOPY;  Service: Endoscopy;                Laterality: N/A; No date: CORONARY ANGIOPLASTY 08/17/2023: ESOPHAGEAL DILATION     Comment:  Procedure: ESOPHAGEAL DILATION;  Surgeon: Maryruth Ole DASEN, MD;  Location: [facility] ENDOSCOPY;  Service:               Endoscopy;; 02/13/2015:  ESOPHAGOGASTRODUODENOSCOPY (EGD) WITH PROPOFOL      Comment:  Procedure: ESOPHAGOGASTRODUODENOSCOPY (EGD) WITH               PROPOFOL ;  Surgeon: Gladis RAYMOND Mariner, MD;  Location:               [facility] ENDOSCOPY;  Service: Endoscopy;; 05/12/2023: ESOPHAGOGASTRODUODENOSCOPY (EGD) WITH PROPOFOL ; N/A     Comment:  Procedure: ESOPHAGOGASTRODUODENOSCOPY (EGD) WITH               PROPOFOL ;  Surgeon: Maryruth Ole DASEN, MD;  Location:               [facility] ENDOSCOPY;  Service: Endoscopy;  Laterality: N/A; 08/17/2023: ESOPHAGOGASTRODUODENOSCOPY (EGD) WITH PROPOFOL ; N/A     Comment:  Procedure: ESOPHAGOGASTRODUODENOSCOPY (EGD) WITH               PROPOFOL ;  Surgeon: Maryruth Ole DASEN, MD;  Location:               [facility] ENDOSCOPY;  Service: Endoscopy;  Laterality: N/A; 04/05/2024: LOWER EXTREMITY ANGIOGRAPHY; Left     Comment:  Procedure: Lower Extremity Angiography;  Surgeon:               Jama Cordella MATSU, MD;  Location: [facility] INVASIVE CV LAB;  Service: Cardiovascular;  Laterality: Left; 05/12/2023: SAVORY DILATION     Comment:  Procedure: SAVORY DILATION;  Surgeon: Maryruth Ole DASEN, MD;  Location: [facility] ENDOSCOPY;  Service: Endoscopy;; No date: TOE AMPUTATION; Right No date: TONSILLECTOMY No date: UPPER GI ENDOSCOPY  BMI    Body Mass Index: 20.92 kg/m      Reproductive/Obstetrics negative OB ROS                              Anesthesia Physical Anesthesia Plan  ASA: 3  Anesthesia Plan: General LMA   Post-op Pain Management:    Induction: Intravenous  PONV Risk Score and Plan:   Airway Management Planned: LMA  Additional Equipment:   Intra-op Plan:   Post-operative Plan: Extubation in OR  Informed Consent: I have reviewed the patients History and Physical, chart, labs and discussed the procedure including the risks, benefits and alternatives for the proposed anesthesia with the patient or authorized representative who has indicated  his/her understanding and acceptance.     Dental Advisory Given  Plan Discussed with: Anesthesiologist, CRNA and Surgeon  Anesthesia Plan Comments: (Surgeon requesting LMA   Patient consented for risks of anesthesia including but not limited to:  - adverse reactions to medications - damage to eyes, teeth, lips or other oral mucosa - nerve damage due to positioning  - sore throat or hoarseness - Damage to heart, brain, nerves, lungs, other parts of body or loss of life  Patient voiced understanding and assent.)         Anesthesia Quick Evaluation

## 2024-04-08 NOTE — Progress Notes (Signed)
 OT Cancellation Note  Patient Details Name: Ernest Carpenter MRN: [id number] DOB: [birth year]   Cancelled Treatment:    Reason Eval/Treat Not Completed: Other (comment). OT orders received, chart reviewed. Per chart, pt awaiting amputation sx today. Will hold OT evaluation & await new post op orders.   Rhodie Cienfuegos R., MPH, MS, OTR/L ascom [phone] 04/08/24, 8:03 AM

## 2024-04-08 NOTE — Progress Notes (Signed)
 [facility]  ROUNDING NOTE   Subjective:  [name] is a 78 y.o.  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 Ashley on 7/4  Creatinine 2.69 (2.83)  UOP  Wife at bedside.    Objective:  Vital signs in last 24 hours:  Temp:  [96.9 F (36.1 C)-97.9 F (36.6 C)] 97.6 F (36.4 C) (07/04 1156) Pulse Rate:  [55-108] 81 (07/04 1156) Resp:  [10-18] 16 (07/04 1156) BP: (115-152)/(56-76) 145/76 (07/04 1156) SpO2:  [92 %-100 %] 94 % (07/04 1156)  Weight change:  Filed Weights   04/04/24 1339  Weight: 68 kg    Intake/Output: I/O last 3 completed shifts: In: 1459 [P.O.:720; I.V.:139; IV Piggyback:600] Out: 2000 [Urine:2000]   Intake/Output this shift:  Total I/O In: 1000 [I.V.:700; IV Piggyback:300] Out: 15 [Blood:15]  Physical Exam: General: NAD,   Head: Normocephalic  Eyes: Anicteric  Neck: Supple  Lungs:  Clear to auscultation, on room air  Heart: Regular rate and rhythm  Abdomen:  Soft, nontender, bowel sounds present  Extremities: No peripheral edema.  Neurologic: Alert  Skin: No lesions  Access: None    Basic Metabolic Panel: Recent Labs  Lab 04/04/24 1343 04/05/24 0437 04/06/24 0518 04/07/24 0251 04/08/24 0451  NA 136 137 136 136 139  K 4.3 4.0 3.9 3.9 4.1  CL 104 108 108 108 109  CO2 21* 21* 19* 19* 21*  GLUCOSE 155* 89 200* 116* 94  BUN 39* 38* 43* 47* 38*  CREATININE 3.07* 2.70* 2.77* 2.83* 2.69*  CALCIUM  8.6* 8.1* 8.2* 7.9* 8.4*  MG  --   --  1.9  --   --     Liver Function Tests: Recent Labs  Lab 04/04/24 1343  AST 51*  ALT 46*  ALKPHOS 116  BILITOT 1.1  PROT 7.4  ALBUMIN 2.7*   No results for  input(s): LIPASE, AMYLASE in the last 168 hours. No results for input(s): AMMONIA in the last 168 hours.  CBC: Recent Labs  Lab 04/04/24 1343 04/05/24 0437 04/06/24 0518 04/07/24 0251 04/08/24 0451  WBC 11.3* 9.8 14.3* 22.2* 11.4*  NEUTROABS 7.7  --   --   --   --   HGB 12.3* 10.2* 11.9* 10.0* 10.9*  HCT 37.6* 31.0* 36.3* 30.0* 33.5*  MCV 82.1 81.8 80.8 80.9 81.9  PLT 517* 434* 509* 479* 480*    Cardiac Enzymes: No results for input(s): CKTOTAL, CKMB, CKMBINDEX, TROPONINI in the last 168 hours.  BNP: Invalid input(s): POCBNP  CBG: Recent Labs  Lab 04/07/24 1154 04/07/24 1652 04/07/24 2254 04/08/24 0745 04/08/24 1103  GLUCAP 144* 176* 97 90 124*    Microbiology: Results for orders placed or performed during the hospital encounter of 04/04/24  Blood culture (single)     Status: None (Preliminary result)   Collection Time: 04/04/24  1:43 PM   Specimen: BLOOD  Result Value Ref Range Status   Specimen Description BLOOD RIGHT ANTECUBITAL  Final   Special Requests   Final    BOTTLES DRAWN AEROBIC AND ANAEROBIC Blood Culture results may not be optimal due to an inadequate volume of blood received in culture bottles   Culture   Final    NO GROWTH 4 DAYS  Performed at [facility], [address] [place], [place] [address]    Report Status PENDING  Incomplete  Blood culture (single)     Status: None (Preliminary result)   Collection Time: 04/04/24  2:20 PM   Specimen: BLOOD  Result Value Ref Range Status   Specimen Description BLOOD RIGHT ANTECUBITAL  Final   Special Requests   Final    BOTTLES DRAWN AEROBIC AND ANAEROBIC Blood Culture results may not be optimal due to an inadequate volume of blood received in culture bottles   Culture   Final    NO GROWTH 4 DAYS Performed at [facility], [address] [place], [place] [address]    Report Status PENDING  Incomplete    Coagulation Studies: No results for input(s): LABPROT,  INR in the last 72 hours.  Urinalysis: No results for input(s): COLORURINE, LABSPEC, PHURINE, GLUCOSEU, HGBUR, BILIRUBINUR, KETONESUR, PROTEINUR, UROBILINOGEN, NITRITE, LEUKOCYTESUR in the last 72 hours.  Invalid input(s): APPERANCEUR    Imaging: DG MINI C-ARM IMAGE ONLY Result Date: 04/08/2024 There is no interpretation for this exam.  This order is for images obtained during a surgical procedure.  Please See Surgeries Tab for more information regarding the procedure.     Medications:    cefTRIAXone  (ROCEPHIN )  IV 0 g (04/07/24 1038)    amLODipine   10 mg Oral Daily   aspirin  EC  81 mg Oral Daily   gabapentin   300 mg Oral TID   insulin  aspart  0-15 Units Subcutaneous TID WC   linezolid   600 mg Oral Q12H   metroNIDAZOLE   500 mg Oral Q12H   pantoprazole   40 mg Oral BID   rosuvastatin   40 mg Oral QHS   sodium chloride  flush  3 mL Intravenous Q12H   HYDROcodone -acetaminophen , ondansetron  **OR** ondansetron  (ZOFRAN ) IV, sodium chloride  flush  Assessment/ Plan:  [name] is a 78 y.o.  male with past medical conditions including CAD status post CABG with stent angioplasty, hypertension, diabetes, atrial fibrillation, PAD and chronic kidney disease stage IIIa, who was admitted to [facility] on 04/04/2024 for AKI (acute kidney injury) (HCC) [N17.9] Gangrene of toe of left foot (HCC) [I96]   1.  Acute kidney injury on chronic kidney disease stage III A.  Baseline creatinine 1.38 with GFR 53 on February 03, 2024.  Acute kidney injury likely secondary to poor oral intake, diuretic/ACE use, and infection.  Renal ultrasound negative for obstruction.  Diuretics and ACE inhibitor's currently held.   Update: No immediate need for dialysis. Now not on IV fluids.  Lab Results  Component Value Date   CREATININE 2.69 (H) 04/08/2024   CREATININE 2.83 (H) 04/07/2024   CREATININE 2.77 (H) 04/06/2024     Intake/Output Summary (Last 24 hours) at 04/08/2024 1220 Last data  filed at 04/08/2024 1101 Gross per 24 hour  Intake 1540 ml  Output 665 ml  Net 875 ml      2. Diabetes mellitus type II with chronic kidney disease/renal manifestations:noninsulin dependent. Home regimen includes metformin. Most recent hemoglobin A1c is 7.0 on 04/04/24.    3.  Hypertension with chronic kidney disease.  Home regimen includes lisinopril  and HCTZ.  All currently held at this time.  Currently on amlodipine     4. Anemia of chronic kidney disease.  Normocytic Recent Labs       Lab Results  Component Value Date    HGB 11.9 (L) 04/06/2024    Hemoglobin acceptable at this time.  No immediate need for Epogen.  LOS: 4 Alaa Mullally 7/4/202512:20 PM

## 2024-04-09 ENCOUNTER — Inpatient Hospital Stay

## 2024-04-09 ENCOUNTER — Encounter: Payer: Self-pay | Admitting: Podiatry

## 2024-04-09 DIAGNOSIS — I96 Gangrene, not elsewhere classified: Secondary | ICD-10-CM | POA: Diagnosis not present

## 2024-04-09 LAB — CULTURE, BLOOD (SINGLE)
Culture: NO GROWTH
Culture: NO GROWTH

## 2024-04-09 LAB — BASIC METABOLIC PANEL WITH GFR
Anion gap: 9 (ref 5–15)
BUN: 37 mg/dL — ABNORMAL HIGH (ref 8–23)
CO2: 19 mmol/L — ABNORMAL LOW (ref 22–32)
Calcium: 7.8 mg/dL — ABNORMAL LOW (ref 8.9–10.3)
Chloride: 110 mmol/L (ref 98–111)
Creatinine, Ser: 2.35 mg/dL — ABNORMAL HIGH (ref 0.61–1.24)
GFR, Estimated: 28 mL/min — ABNORMAL LOW (ref >60)
Glucose, Bld: 126 mg/dL — ABNORMAL HIGH (ref 70–99)
Potassium: 3.8 mmol/L (ref 3.5–5.1)
Sodium: 138 mmol/L (ref 135–145)

## 2024-04-09 LAB — GLUCOSE, CAPILLARY
Glucose-Capillary: 116 mg/dL — ABNORMAL HIGH (ref 70–99)
Glucose-Capillary: 176 mg/dL — ABNORMAL HIGH (ref 70–99)
Glucose-Capillary: 145 mg/dL — ABNORMAL HIGH (ref 70–99)
Glucose-Capillary: 144 mg/dL — ABNORMAL HIGH (ref 70–99)

## 2024-04-09 MED ORDER — CLOPIDOGREL BISULFATE 75 MG PO TABS
75.0000 mg | ORAL_TABLET | Freq: Every day | ORAL | Status: DC
  Administered 2024-04-09 – 2024-04-10 (×2): 75 mg via ORAL
  Filled 2024-04-09 (×2): qty 1

## 2024-04-09 MED ORDER — SODIUM CHLORIDE 0.9 % IV SOLN: INTRAVENOUS | Status: AC

## 2024-04-09 NOTE — Plan of Care (Signed)

## 2024-04-09 NOTE — Evaluation (Signed)
 Physical Therapy Evaluation Patient Details Name: Ernest Carpenter MRN: [id number] DOB: 03/20/1946 Today's Date: 04/09/2024  History of Present Illness  Ernest Carpenter is a 78 y.o. male with medical history significant for coronary artery disease status post CABG and stent angioplasty, diabetes mellitus, hypertension, history of atrial fibrillation, history of PAD who presents to the emergency room from the podiatrist's office for evaluation of gangrene involving the left fifth toe. S/P transmet amputation L foot 7/4. NWB L LE  Clinical Impression  Patient received in bed, he is pleasant and agrees to PT/OT assessment. He is mod I with bed mobility, but patient requires max cues for L NWB and safety during mobility. Attempting to get up without walker present. He was able to stand from elevated bed with mod A +2. Able to hop a few steps from bed to recliner with min A +2 and RW. Poor balance with hopping. Patient will continue to benefit from skilled PT to improve functional mobility, safety and independence. We discussed trial of knee scooter vs wheelchair for longer distances and safety.          If plan is discharge home, recommend the following: A lot of help with walking and/or transfers;A little help with bathing/dressing/bathroom;Help with stairs or ramp for entrance;Assist for transportation;Assistance with cooking/housework   Can travel by private vehicle   Yes    Equipment Recommendations Other (comment) (TBD, knee scooter vs wheelchair)  Recommendations for Other Services       Functional Status Assessment Patient has had a recent decline in their functional status and demonstrates the ability to make significant improvements in function in a reasonable and predictable amount of time.     Precautions / Restrictions Precautions Precautions: Fall Recall of Precautions/Restrictions: Impaired Restrictions Weight Bearing Restrictions Per Provider Order: Yes LLE Weight Bearing Per  Provider Order: Non weight bearing Other Position/Activity Restrictions: post op shoe      Mobility  Bed Mobility Overal bed mobility: Modified Independent                  Transfers Overall transfer level: Needs assistance Equipment used: Rolling walker (2 wheels) Transfers: Sit to/from Stand, Bed to chair/wheelchair/BSC Sit to Stand: From elevated surface, Mod assist, +2 physical assistance   Step pivot transfers: Min assist, +2 physical assistance, +2 safety/equipment       General transfer comment: multiple cues needed for NWB on left, hand placement, safety, he was able to hop pivot to recliner with min +2 assist. Poor tolerance for this, decreased balance    Ambulation/Gait                  Stairs            Wheelchair Mobility     Tilt Bed    Modified Rankin (Stroke Patients Only)       Balance Overall balance assessment: Needs assistance Sitting-balance support: Feet supported Sitting balance-Leahy Scale: Good     Standing balance support: Bilateral upper extremity supported, During functional activity, Reliant on assistive device for balance Standing balance-Leahy Scale: Poor                               Pertinent Vitals/Pain Pain Assessment Pain Assessment: No/denies pain    Home Living Family/patient expects to be discharged to:: Private residence Living Arrangements: Spouse/significant other;Children Available Help at Discharge: Family;Available 24 hours/day Type of Home: House Home Access: Stairs to enter Entrance  Stairs-Rails: None Entrance Stairs-Number of Steps: 1   Home Layout: One level Home Equipment: Rollator (4 wheels)      Prior Function Prior Level of Function : Independent/Modified Independent;Driving             Mobility Comments: No AD use prior to surgery ADLs Comments: Independent     Extremity/Trunk Assessment   Upper Extremity Assessment Upper Extremity Assessment: Overall WFL  for tasks assessed    Lower Extremity Assessment Lower Extremity Assessment: LLE deficits/detail;Defer to PT evaluation LLE Deficits / Details: NWB L LE    Cervical / Trunk Assessment Cervical / Trunk Assessment: Normal  Communication   Communication Communication: No apparent difficulties    Cognition Arousal: Alert Behavior During Therapy: WFL for tasks assessed/performed   PT - Cognitive impairments: Problem solving, Safety/Judgement                         Following commands: Impaired Following commands impaired: Follows one step commands inconsistently, Follows one step commands with increased time     Cueing Cueing Techniques: Verbal cues, Gestural cues, Tactile cues     General Comments      Exercises     Assessment/Plan    PT Assessment Patient needs continued PT services  PT Problem List Decreased activity tolerance;Decreased balance;Decreased mobility;Decreased coordination;Decreased cognition;Decreased safety awareness;Decreased knowledge of use of DME;Decreased knowledge of precautions;Decreased skin integrity       PT Treatment Interventions DME instruction;Gait training;Stair training;Functional mobility training;Therapeutic activities;Therapeutic exercise;Balance training;Patient/family education;Wheelchair mobility training    PT Goals (Current goals can be found in the Care Plan section)  Acute Rehab PT Goals Patient Stated Goal: return home PT Goal Formulation: With patient Time For Goal Achievement: 04/16/24 Potential to Achieve Goals: Fair    Frequency Min 3X/week     Co-evaluation PT/OT/SLP Co-Evaluation/Treatment: Yes Reason for Co-Treatment: For patient/therapist safety;To address functional/ADL transfers PT goals addressed during session: Mobility/safety with mobility;Balance;Proper use of DME         AM-PAC PT 6 Clicks Mobility  Outcome Measure Help needed turning from your back to your side while in a flat bed  without using bedrails?: None Help needed moving from lying on your back to sitting on the side of a flat bed without using bedrails?: None Help needed moving to and from a bed to a chair (including a wheelchair)?: A Lot Help needed standing up from a chair using your arms (e.g., wheelchair or bedside chair)?: A Lot Help needed to walk in hospital room?: A Lot Help needed climbing 3-5 steps with a railing? : Total 6 Click Score: 15    End of Session Equipment Utilized During Treatment: Gait belt Activity Tolerance: Patient limited by fatigue Patient left: in chair;with call bell/phone within reach;with chair alarm set;with family/visitor present Nurse Communication: Mobility status PT Visit Diagnosis: Other abnormalities of gait and mobility (R26.89);Unsteadiness on feet (R26.81);Difficulty in walking, not elsewhere classified (R26.2)    Time: 9091-9079 PT Time Calculation (min) (ACUTE ONLY): 12 min   Charges:   PT Evaluation $PT Eval Moderate Complexity: 1 Mod   PT General Charges $$ ACUTE PT VISIT: 1 Visit        Khalaya Mcgurn, PT, GCS 04/09/24,10:23 AM

## 2024-04-09 NOTE — Progress Notes (Signed)
 [facility]  ROUNDING NOTE   Subjective:  [name] is a 78 y.o.  male with past medical conditions including CAD status post CABG with stent angioplasty, hypertension, diabetes, atrial fibrillation, PAD and chronic kidney disease stage IIIa, who was admitted to [facility] on 04/04/2024 for AKI (acute kidney injury) (HCC) [N17.9] Gangrene of toe of left foot (HCC) [I96]  Status post left transmetatarsal amputation by Dr. Ashley on 7/4  Creatinine 2.35 (2.69) (2.83)  Family member at bedside.    Objective:  Vital signs in last 24 hours:  Temp:  [97.7 F (36.5 C)-98.1 F (36.7 C)] 97.7 F (36.5 C) (07/05 0800) Pulse Rate:  [70-88] 81 (07/05 0800) Resp:  [16-18] 17 (07/05 0800) BP: (111-133)/(64-69) 130/64 (07/05 0800) SpO2:  [95 %-100 %] 95 % (07/05 0800)  Weight change:  Filed Weights   04/04/24 1339  Weight: 68 kg    Intake/Output: I/O last 3 completed shifts: In: 3223.4 [P.O.:920; I.V.:1504.3; IV Piggyback:799.1] Out: 2140 [Urine:2125; Blood:15]   Intake/Output this shift:  Total I/O In: 1082.7 [P.O.:360; I.V.:722.7] Out: -   Physical Exam: General: NAD, laying in bed  Head: Normocephalic  Eyes: Anicteric  Neck: Supple  Lungs:  Clear to auscultation, on room air  Heart: Regular rate and rhythm  Abdomen:  Soft, nontender, bowel sounds present  Extremities: Left foot in dressings - clean and dry  Neurologic: Alert  Skin: No lesions  Access: None    Basic Metabolic Panel: Recent Labs  Lab 04/05/24 0437 04/06/24 0518 04/07/24 0251 04/08/24 0451 04/09/24 0531  NA 137 136 136 139 138  K 4.0 3.9 3.9 4.1 3.8  CL 108 108 108 109 110  CO2 21* 19* 19* 21* 19*  GLUCOSE 89 200* 116* 94 126*  BUN 38* 43* 47* 38* 37*  CREATININE 2.70* 2.77* 2.83* 2.69* 2.35*  CALCIUM  8.1* 8.2* 7.9* 8.4* 7.8*  MG  --  1.9  --   --   --     Liver Function Tests: Recent Labs  Lab 04/04/24 1343  AST 51*  ALT 46*  ALKPHOS 116  BILITOT 1.1  PROT 7.4   ALBUMIN 2.7*   No results for input(s): LIPASE, AMYLASE in the last 168 hours. No results for input(s): AMMONIA in the last 168 hours.  CBC: Recent Labs  Lab 04/04/24 1343 04/05/24 0437 04/06/24 0518 04/07/24 0251 04/08/24 0451  WBC 11.3* 9.8 14.3* 22.2* 11.4*  NEUTROABS 7.7  --   --   --   --   HGB 12.3* 10.2* 11.9* 10.0* 10.9*  HCT 37.6* 31.0* 36.3* 30.0* 33.5*  MCV 82.1 81.8 80.8 80.9 81.9  PLT 517* 434* 509* 479* 480*    Cardiac Enzymes: No results for input(s): CKTOTAL, CKMB, CKMBINDEX, TROPONINI in the last 168 hours.  BNP: Invalid input(s): POCBNP  CBG: Recent Labs  Lab 04/08/24 0745 04/08/24 1103 04/08/24 1643 04/09/24 0802 04/09/24 1157  GLUCAP 90 124* 234* 116* 176*    Microbiology: Results for orders placed or performed during the hospital encounter of 04/04/24  Blood culture (single)     Status: None   Collection Time: 04/04/24  1:43 PM   Specimen: BLOOD  Result Value Ref Range Status   Specimen Description BLOOD RIGHT ANTECUBITAL  Final   Special Requests   Final    BOTTLES DRAWN AEROBIC AND ANAEROBIC Blood Culture results may not be optimal due to an inadequate volume of blood received in culture bottles   Culture   Final  NO GROWTH 5 DAYS Performed at [facility], [address] [place], [place] [address]    Report Status 04/09/2024 FINAL  Final  Blood culture (single)     Status: None   Collection Time: 04/04/24  2:20 PM   Specimen: BLOOD  Result Value Ref Range Status   Specimen Description BLOOD RIGHT ANTECUBITAL  Final   Special Requests   Final    BOTTLES DRAWN AEROBIC AND ANAEROBIC Blood Culture results may not be optimal due to an inadequate volume of blood received in culture bottles   Culture   Final    NO GROWTH 5 DAYS Performed at [facility], [address] [place], [place] [address]    Report Status 04/09/2024 FINAL  Final  Aerobic/Anaerobic Culture w Gram Stain (surgical/deep  wound)     Status: None (Preliminary result)   Collection Time: 04/08/24 10:13 AM   Specimen: Foot, Left; Wound  Result Value Ref Range Status   Specimen Description   Final    WOUND Performed at [facility], [address] [place], [place] [address]    Special Requests   Final     LEFT FOOT Performed at [facility], [address] [place], [place] [address]    Gram Stain   Final    RARE WBC SEEN NO EPITHELIAL CELLS SEEN  NO ORGANISMS SEEN Performed at [facility], [address] [place], [place] [address]    Culture PENDING  Incomplete   Report Status PENDING  Incomplete    Coagulation Studies: No results for input(s): LABPROT, INR in the last 72 hours.  Urinalysis: No results for input(s): COLORURINE, LABSPEC, PHURINE, GLUCOSEU, HGBUR, BILIRUBINUR, KETONESUR, PROTEINUR, UROBILINOGEN, NITRITE, LEUKOCYTESUR in the last 72 hours.  Invalid input(s): APPERANCEUR    Imaging: DG MINI C-ARM IMAGE ONLY Result Date: 04/08/2024 There is no interpretation for this exam.  This order is for images obtained during a surgical procedure.  Please See Surgeries Tab for more information regarding the procedure.     Medications:    sodium chloride  100 mL/hr at 04/09/24 9294    amLODipine   10 mg Oral Daily   amoxicillin -clavulanate  1 tablet Oral BID   aspirin  EC  81 mg Oral Daily   clopidogrel   75 mg Oral Daily   gabapentin   300 mg Oral TID   insulin  aspart  0-15 Units Subcutaneous TID WC   pantoprazole   40 mg Oral BID   rosuvastatin   40 mg Oral QHS   sodium chloride  flush  3 mL Intravenous Q12H   HYDROcodone -acetaminophen , melatonin, ondansetron  **OR** ondansetron  (ZOFRAN ) IV, sodium chloride  flush  Assessment/ Plan:  [name] is a 78 y.o.  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  Acute kidney injury on chronic kidney disease stage III A.  Baseline creatinine 1.38 with GFR 53 on February 03, 2024.  Acute kidney injury likely secondary to poor oral intake, diuretic/ACE use, and infection.  Renal ultrasound negative for obstruction.  Diuretics and ACE inhibitor's currently held.   Creatinine trending down Lab Results  Component Value Date   CREATININE 2.35 (H) 04/09/2024   CREATININE 2.69 (H) 04/08/2024   CREATININE 2.83 (H) 04/07/2024     Intake/Output Summary (Last 24  hours) at 04/09/2024 1218 Last data filed at 04/09/2024 1047 Gross per 24 hour  Intake 3006.04 ml  Output 1475 ml  Net 1531.04 ml      2. Diabetes mellitus type II with chronic kidney disease/renal manifestations:noninsulin dependent. Home regimen includes metformin. Most recent hemoglobin A1c is 7.0 on 04/04/24.    3.  Hypertension with chronic kidney disease.  Home regimen includes lisinopril  and hydrochlorothiazide  which are being held  Currently on amlodipine     4. Anemia of chronic kidney disease.  Normocytic Recent Labs       Lab Results  Component Value Date    HGB 11.9 (L) 04/06/2024    Hemoglobin acceptable at this time.  No immediate need for Epogen.   LOS: 5 Verma Grothaus 7/5/202512:18 PM

## 2024-04-09 NOTE — Progress Notes (Addendum)
 Daily Progress Note   Subjective  - 1 Day Post-Op  Status post 1 day left foot transmetatarsal amputation.  Patient doing well.  Minimal pain.  Sitting in a chair resting comfortably with foot elevated.  Objective Vitals:   04/08/24 1642 04/08/24 1953 04/09/24 0024 04/09/24 0800  BP: 127/65 133/67 111/69 130/64  Pulse: 88 83 70 81  Resp: 16 18 18 17   Temp: 98 F (36.7 C) 97.8 F (36.6 C) 98.1 F (36.7 C) 97.7 F (36.5 C)  TempSrc: Oral   Oral  SpO2: 97% 100% 98% 95%  Weight:      Height:        Physical Exam: Dressing changed.  Incision is well coapted.  Good perfusion of the skin flaps.  No purulent drainage.  No erythema.  Edema is mild.  Culture day 1 no organisms       Laboratory CBC    Component Value Date/Time   WBC 11.4 (H) 04/08/2024 0451   HGB 10.9 (L) 04/08/2024 0451   HGB 12.4 (L) 10/09/2013 0553   HCT 33.5 (L) 04/08/2024 0451   HCT 37.2 (L) 10/09/2013 0553   PLT 480 (H) 04/08/2024 0451   PLT 248 10/09/2013 0553    BMET    Component Value Date/Time   NA 138 04/09/2024 0531   NA 138 10/09/2013 0553   K 3.8 04/09/2024 0531   K 3.9 10/09/2013 0553   CL 110 04/09/2024 0531   CL 103 10/09/2013 0553   CO2 19 (L) 04/09/2024 0531   CO2 30 10/09/2013 0553   GLUCOSE 126 (H) 04/09/2024 0531   GLUCOSE 137 (H) 10/09/2013 0553   BUN 37 (H) 04/09/2024 0531   BUN 23 (H) 10/09/2013 0553   CREATININE 2.35 (H) 04/09/2024 0531   CREATININE 1.18 10/09/2013 0553   CALCIUM  7.8 (L) 04/09/2024 0531   CALCIUM  8.9 10/09/2013 0553   GFRNONAA 28 (L) 04/09/2024 0531   GFRNONAA >60 10/09/2013 0553   GFRAA >60 06/07/2017 0557   GFRAA >60 10/09/2013 0553    Assessment/Planning: Gangrene left forefoot status post transmetatarsal amputation Diabetes with peripheral vascular disease  Dressing changed today.  Good perfusion of the skin flaps.  New bandages applied. Therapy has been working with patient.  Recommendation for SNF noted.  Patient seems somewhat  resistant at this time We will continue to follow.  Appreciate physical therapy assistance.  Dressing will be changed tomorrow. If wound continues to improve from podiatry standpoint suspect can be discharged in 1 to 2 days. Will follow while in house  Ashley Soulier A  04/09/2024, 11:10 AM

## 2024-04-09 NOTE — Progress Notes (Addendum)
  Progress Note   Patient: Ernest Carpenter FMW:[id number] DOB: [birth year] DOA: 04/04/2024     5 DOS: the patient was seen and examined on 04/09/2024   Brief hospital course:  Ernest Carpenter is a 78 y.o. male with medical history significant for coronary artery disease status post CABG and stent angioplasty, diabetes mellitus, hypertension, history of atrial fibrillation, history of PAD who presents to the emergency room from the podiatrist's office for evaluation of gangrene involving the left fifth toe.  Patient did not have fever, white count 11.3.  Creatinine 3.07, MRI of the left foot showed 5th toe osteomyelitis, soft tissue with gas. Patient initially given cefepime  and vancomycin , followed by Rocephin  and Flagyl .   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 Diabetic foot ulcer associated with type 2 diabetes mellitus (HCC) PAD Patient has peripheral arterial disease.  Angiogram with angiplasty and stent to left superficial femoral artery performed on 7/1. TMA performed by podiatry today Blood cultures so far has no growth. S/p linezolid /ceftriaxone /flagyl , have transitioned to augmentin  Aspirin /plavix  Possible d/c tomorrow if healing adequately Home with home health  Acute renal failure superimposed on stage 3b chronic kidney disease (HCC) Likely secondary to infection, home bp meds (acei, thiazide). Renal u/s no bstruction. Nephrology following. Kidney function now improving Continuing gentle fluids  CAD (coronary  artery disease) History CABG 2023 asymptomatic Continue atenolol  with holding parameters and statins Heparin  and antiplatelets as above  Essential hypertension Well controlled Continue atenolol  and amlodipine   Diabetes mellitus without complication (HCC) controlled Continue sliding scale insulin .      Subjective:  No pain or fevers, tolerating diet, feeling fine  Physical Exam: Vitals:   04/08/24 1642 04/08/24 1953 04/09/24 0024 04/09/24 0800  BP: 127/65 133/67 111/69 130/64  Pulse: 88 83 70 81  Resp: 16 18 18 17   Temp: 98 F (36.7 C) 97.8 F (36.6 C) 98.1 F (36.7 C) 97.7 F (36.5 C)  TempSrc: Oral   Oral  SpO2: 97% 100% 98% 95%  Weight:      Height:       General exam: Appears calm and comfortable  Respiratory system: Clear to auscultation. Respiratory effort normal. Cardiovascular system: S1 & S2 heard, RRR. Soft systolic murmur Gastrointestinal system: Abdomen is nondistended, soft and nontender.   Central nervous system: Alert and oriented. No focal neurological deficits. Extremities: Symmetric 5 x 5 power. Skin: dressing over left foot, mild LE edema Psychiatry: Judgement and insight appear normal. Mood & affect appropriate.    Data Reviewed:  MRI results and lab results reviewed.  Family Communication: sister at bedside 7/5  Disposition: Status is: Inpatient Remains inpatient appropriate because: Severity of disease, IV treatment, inpatient procedures      Author: Devaughn KATHEE Ban, MD 04/09/2024 2:54 PM  For on call review [email or web address].

## 2024-04-09 NOTE — Evaluation (Signed)
 Occupational Therapy Evaluation Patient Details Name: Ernest Carpenter MRN: [id number] DOB: 11-01-1945 Today's Date: 04/09/2024   History of Present Illness   Ernest Carpenter is a 78 y.o. male with medical history significant for coronary artery disease status post CABG and stent angioplasty, diabetes mellitus, hypertension, history of atrial fibrillation, history of PAD who presents to the emergency room from the podiatrist's office for evaluation of gangrene involving the left fifth toe. S/P transmet amputation L foot 7/4. NWB L LE   Clinical Impressions Pt was seen for OT evaluation this date. Prior to hospital admission, pt was independent in all aspects and is eager to return to PLOF. Pt lives with his spouse and daughter. Pt presents to acute OT demonstrating impaired ADL performance and functional mobility 2/2 decreased safety awareness, balance, and strength, now with TMA and LLE NWBing (See OT problem list for additional functional deficits). Pt currently requires MIN A +2 for STS and hop pivot to the recliner with MAX VC For sequencing/safety/NWBing. Unable to maintain NWBing. Unsafe movements. Pt would benefit from skilled OT services to address noted impairments and functional limitations (see below for any additional details) in order to maximize safety and independence while minimizing falls risk and caregiver burden. Anticipate the need for follow up OT services upon acute hospital DC.    If plan is discharge home, recommend the following:   A lot of help with bathing/dressing/bathroom;A lot of help with walking and/or transfers;Assistance with cooking/housework;Assist for transportation;Help with stairs or ramp for entrance;Supervision due to cognitive status     Functional Status Assessment   Patient has had a recent decline in their functional status and demonstrates the ability to make significant improvements in function in a reasonable and predictable amount of time.      Equipment Recommendations   Other (comment);Wheelchair (measurements OT);Wheelchair cushion (measurements OT) (friend has 2WW)     Recommendations for Other Services         Precautions/Restrictions   Precautions Precautions: Fall Recall of Precautions/Restrictions: Impaired Restrictions Weight Bearing Restrictions Per Provider Order: Yes LLE Weight Bearing Per Provider Order: Non weight bearing Other Position/Activity Restrictions: post op shoe     Mobility Bed Mobility Overal bed mobility: Modified Independent                  Transfers Overall transfer level: Needs assistance Equipment used: Rolling walker (2 wheels) Transfers: Sit to/from Stand, Bed to chair/wheelchair/BSC Sit to Stand: From elevated surface, Mod assist, +2 physical assistance     Step pivot transfers: Min assist, +2 physical assistance, +2 safety/equipment     General transfer comment: multiple cues needed for NWB on left, hand placement, safety, he was able to hop pivot to recliner with min +2 assist. Poor tolerance for this, decreased balance      Balance Overall balance assessment: Needs assistance Sitting-balance support: Feet supported Sitting balance-Leahy Scale: Good     Standing balance support: Bilateral upper extremity supported, During functional activity, Reliant on assistive device for balance Standing balance-Leahy Scale: Poor                             ADL either performed or assessed with clinical judgement   ADL Overall ADL's : Needs assistance/impaired                     Lower Body Dressing: Sitting/lateral leans;Maximal assistance;Moderate assistance Lower Body Dressing Details (indicate cue type and reason):  don post op shoe, anticipate MOD A For LB dressing otherwise involving STS transfer                     Vision         Perception         Praxis         Pertinent Vitals/Pain Pain Assessment Pain Assessment:  No/denies pain     Extremity/Trunk Assessment Upper Extremity Assessment Upper Extremity Assessment: Overall WFL for tasks assessed   Lower Extremity Assessment Lower Extremity Assessment: LLE deficits/detail;Defer to PT evaluation LLE Deficits / Details: NWB L LE   Cervical / Trunk Assessment Cervical / Trunk Assessment: Normal   Communication Communication Communication: No apparent difficulties   Cognition Arousal: Alert Behavior During Therapy: Impulsive Cognition: No family/caregiver present to determine baseline             OT - Cognition Comments: a bit impulsive, difficulty following commands, decr safety awareness                 Following commands: Impaired Following commands impaired: Follows one step commands inconsistently, Follows one step commands with increased time     Cueing  General Comments   Cueing Techniques: Verbal cues;Gestural cues;Tactile cues      Exercises Other Exercises Other Exercises: Pt educated in NWBing precautions and how to maintain during ADL mobility, assist to maintain NWBing throughout required   Shoulder Instructions      Home Living Family/patient expects to be discharged to:: Private residence Living Arrangements: Spouse/significant other;Children Available Help at Discharge: Family;Available 24 hours/day Type of Home: House Home Access: Stairs to enter [employer] of Steps: 1 Entrance Stairs-Rails: None Home Layout: One level     Bathroom Shower/Tub: Tub/shower unit         Home Equipment: Rollator (4 wheels)          Prior Functioning/Environment Prior Level of Function : Independent/Modified Independent;Driving             Mobility Comments: No AD use prior to surgery ADLs Comments: Independent    OT Problem List: Decreased strength;Decreased activity tolerance;Decreased safety awareness;Impaired balance (sitting and/or standing);Decreased knowledge of use of DME or  AE;Decreased knowledge of precautions   OT Treatment/Interventions: Self-care/ADL training;Therapeutic exercise;Therapeutic activities;DME and/or AE instruction;Patient/family education;Balance training;Energy conservation      OT Goals(Current goals can be found in the care plan section)   Acute Rehab OT Goals Patient Stated Goal: get out of here OT Goal Formulation: With patient Time For Goal Achievement: 04/23/24 Potential to Achieve Goals: Good ADL Goals Pt Will Perform Lower Body Dressing: sitting/lateral leans;with modified independence Pt Will Transfer to Toilet: with modified independence;bedside commode (LRAD, NWBing LLE) Pt Will Perform Toileting - Clothing Manipulation and hygiene: with modified independence;sitting/lateral leans Additional ADL Goal #1: Pt will complete SPT to/from [facility] using LRAD while maintaining NWBing LLE requiring mod indep, 5/5 opportunities.   OT Frequency:  Min 2X/week    Co-evaluation PT/OT/SLP Co-Evaluation/Treatment: Yes Reason for Co-Treatment: For patient/therapist safety;To address functional/ADL transfers PT goals addressed during session: Mobility/safety with mobility;Balance;Proper use of DME        AM-PAC OT 6 Clicks Daily Activity     Outcome Measure Help from another person eating meals?: None Help from another person taking care of personal grooming?: None Help from another person toileting, which includes using toliet, bedpan, or urinal?: A Lot Help from another person bathing (including washing, rinsing, drying)?: A Lot Help from another person  to put on and taking off regular upper body clothing?: None Help from another person to put on and taking off regular lower body clothing?: A Lot 6 Click Score: 18   End of Session Equipment Utilized During Treatment: Rolling walker (2 wheels) Nurse Communication: Mobility status  Activity Tolerance: Patient tolerated treatment well Patient left: in chair;with call bell/phone within  reach;with chair alarm set;with family/visitor present  OT Visit Diagnosis: Other abnormalities of gait and mobility (R26.89)                Time: 9091-9079 OT Time Calculation (min): 12 min Charges:  OT General Charges $OT Visit: 1 Visit OT Evaluation $OT Eval Low Complexity: 1 Low  Warren SAUNDERS., MPH, MS, OTR/L ascom [phone] 04/09/24, 10:23 AM

## 2024-04-10 DIAGNOSIS — I96 Gangrene, not elsewhere classified: Secondary | ICD-10-CM | POA: Diagnosis not present

## 2024-04-10 LAB — GLUCOSE, CAPILLARY
Glucose-Capillary: 95 mg/dL (ref 70–99)
Glucose-Capillary: 113 mg/dL — ABNORMAL HIGH (ref 70–99)
Glucose-Capillary: 151 mg/dL — ABNORMAL HIGH (ref 70–99)

## 2024-04-10 LAB — BASIC METABOLIC PANEL WITH GFR
Anion gap: 7 (ref 5–15)
BUN: 32 mg/dL — ABNORMAL HIGH (ref 8–23)
CO2: 20 mmol/L — ABNORMAL LOW (ref 22–32)
Calcium: 7.9 mg/dL — ABNORMAL LOW (ref 8.9–10.3)
Chloride: 110 mmol/L (ref 98–111)
Creatinine, Ser: 2.03 mg/dL — ABNORMAL HIGH (ref 0.61–1.24)
GFR, Estimated: 33 mL/min — ABNORMAL LOW (ref >60)
Glucose, Bld: 97 mg/dL (ref 70–99)
Potassium: 3.6 mmol/L (ref 3.5–5.1)
Sodium: 137 mmol/L (ref 135–145)

## 2024-04-10 MED ORDER — CLOPIDOGREL BISULFATE 75 MG PO TABS: 75.0000 mg | ORAL_TABLET | Freq: Every day | ORAL | 1 refills | Status: AC

## 2024-04-10 MED ORDER — AMOXICILLIN-POT CLAVULANATE 500-125 MG PO TABS: 1.0000 | ORAL_TABLET | Freq: Two times a day (BID) | ORAL | 0 refills | Status: AC

## 2024-04-10 MED ORDER — HYDROCODONE-ACETAMINOPHEN 5-325 MG PO TABS: 1.0000 | ORAL_TABLET | ORAL | 0 refills | Status: AC | PRN

## 2024-04-10 NOTE — Discharge Summary (Signed)
 Ernest Carpenter FMW:[id number] DOB: 09-10-1946 DOA: 04/04/2024  PCP: Steva Clotilda DEL, NP  Admit date: 04/04/2024 Discharge date: 04/10/2024  Time spent: 35 minutes  Recommendations for Outpatient Follow-up:  Podiatry f/u 1-2 weeks Pcp f/u 1 week, consider alternate to farxiga, also check kidney function    Discharge Diagnoses:  Principal Problem:   Gangrene of toe of left foot (HCC) Active Problems:   Diabetic foot ulcer associated with type 2 diabetes mellitus (HCC)   CAD (coronary artery disease)   Acute renal failure superimposed on stage 3b chronic kidney disease (HCC)   Essential hypertension   Diabetes mellitus without complication (HCC)   Reactive thrombocytosis   Metabolic acidosis   Acute osteomyelitis of left foot (HCC)   Discharge Condition: stable  Diet recommendation: heart healthy  Filed Weights   04/04/24 1339  Weight: 68 kg    History of present illness:  From admission h and p Ernest Carpenter is a 78 y.o. male with medical history significant for coronary artery disease status post CABG and stent angioplasty, diabetes mellitus, hypertension, history of atrial fibrillation, history of PAD who presents to the emergency room from the podiatrist's office for evaluation of gangrene involving the left fifth toe. Patient notes that he has had issues with his left toe which has progressively worsened over the last several weeks and now has a darkish discoloration and foul smell to it as well as drainage.  He notes that his oral intake has been very poor due to lack of appetite. He denies having any fever or chills, no chest pain, no shortness of breath, no headache, no blurred vision, no abdominal pain, no nausea, no vomiting, no changes in his bowel habits, no urinary symptoms, no blurred vision.  Hospital Course:    Gangrene of toe of left foot (HCC) Left fifth toe acute osteomyelitis. Diabetic foot ulcer associated with type 2 diabetes mellitus  (HCC) PAD Patient has peripheral arterial disease.  Angiogram with angiplasty and stent to left superficial femoral artery performed on 7/1. TMA performed by podiatry on 7/4 Blood cultures so far has no growth. S/p linezolid /ceftriaxone /flagyl , have transitioned to augmentin , op culture growing staph Aspirin /plavix  Ok for d/c per podiatry 3x weekly dressing changes, rn home health and pt/ot ordered, nursing to provide some supplies and instruct patient/sister in these dressing changes as well Podiatry f/u 2 weeks (one week of home health nursing falls through)   Acute renal failure superimposed on stage 3b chronic kidney disease (HCC) Likely secondary to infection, home bp meds (acei, thiazide). Renal u/s no bstruction. Nephrology following. Kidney function now improving Check bmp 1 week, consider nephrology referral if fails to continue to improve   CAD (coronary artery disease) History CABG 2023 asymptomatic Continue atenolol  with holding parameters and statins Heparin  and antiplatelets as above   Essential hypertension Well controlled Continue atenolol  and amlodipine  Lisinopril  on pause given aki   Diabetes mellitus without complication (HCC) controlled Hold farxiga given infection/amputation and aki  Procedures: See above   Consultations: Podiatry, vascular surgery  Discharge Exam: Vitals:   04/10/24 0505 04/10/24 0757  BP: 129/81 (!) 147/78  Pulse: 83 84  Resp:  16  Temp: 97.6 F (36.4 C) 98.1 F (36.7 C)  SpO2: 96% 95%    General exam: Appears calm and comfortable  Respiratory system: Clear to auscultation. Respiratory effort normal. Cardiovascular system: S1 & S2 heard, RRR. Soft systolic murmur Gastrointestinal system: Abdomen is nondistended, soft and nontender.   Central nervous system: Alert and oriented.  No focal neurological deficits. Extremities: Symmetric 5 x 5 power. Skin: dressing over left foot, mild LE edema Psychiatry: Judgement and insight  appear normal. Mood & affect appropriate.   Discharge Instructions   Discharge Instructions     Diet - low sodium heart healthy   Complete by: As directed    Discharge wound care:   Complete by: As directed    See discharge instructions   Increase activity slowly   Complete by: As directed       Allergies as of 04/10/2024       Reactions   Ivp Dye [iodinated Contrast Media] Rash        Medication List     PAUSE taking these medications    Jardiance 25 MG Tabs tablet Wait to take this until your doctor or other care provider tells you to start again. Generic drug: empagliflozin Take 1 tablet by mouth daily.   lisinopril  2.5 MG tablet Wait to take this until your doctor or other care provider tells you to start again. Commonly known as: ZESTRIL  Take 2.5 mg by mouth daily.       STOP taking these medications    atenolol  25 MG tablet Commonly known as: TENORMIN    atorvastatin  10 MG tablet Commonly known as: LIPITOR   glipiZIDE 10 MG tablet Commonly known as: GLUCOTROL   lisinopril -hydrochlorothiazide  20-25 MG tablet Commonly known as: ZESTORETIC    metFORMIN 1000 MG tablet Commonly known as: GLUCOPHAGE       TAKE these medications    acetaminophen  500 MG tablet Commonly known as: TYLENOL  Take 1,000 mg by mouth every 8 (eight) hours as needed for mild pain or moderate pain.   amLODipine  10 MG tablet Commonly known as: NORVASC  Take 10 mg by mouth daily.   amoxicillin -clavulanate 500-125 MG tablet Commonly known as: AUGMENTIN  Take 1 tablet by mouth 2 (two) times daily.   aspirin  81 MG tablet Take 81 mg by mouth daily.   clopidogrel  75 MG tablet Commonly known as: PLAVIX  Take 1 tablet (75 mg total) by mouth daily.   gabapentin  800 MG tablet Commonly known as: NEURONTIN  Take 800 mg by mouth 3 (three) times daily. What changed: Another medication with the same name was removed. Continue taking this medication, and follow the directions you see  here.   HYDROcodone -acetaminophen  5-325 MG tablet Commonly known as: NORCO/VICODIN Take 1 tablet by mouth every 4 (four) hours as needed for moderate pain (pain score 4-6).   pantoprazole  40 MG tablet Commonly known as: PROTONIX  Take 1 tablet (40 mg total) by mouth 2 (two) times daily.   rosuvastatin  40 MG tablet Commonly known as: CRESTOR  Take 1 tablet by mouth at bedtime.               Durable Medical Equipment  (From admission, onward)           Start     Ordered   04/10/24 1416  For home use only DME Other see comment  Once       Comments: Transport chair  Question:  Length of Need  Answer:  6 Months   04/10/24 1415   04/10/24 1403  For home use only DME Walker rolling  Once       Question Answer Comment  Walker: With 5 Inch Wheels   Patient needs a walker to treat with the following condition Foot amputee (HCC)      04/10/24 1402  Discharge Care Instructions  (From admission, onward)           Start     Ordered   04/10/24 0000  Discharge wound care:       Comments: See discharge instructions   04/10/24 1616           Allergies  Allergen Reactions   Ivp Dye [Iodinated Contrast Media] Rash    Follow-up Information     Schnier, Cordella MATSU, MD Follow up in 6 week(s).   Specialties: Vascular Surgery, Cardiology, Radiology, Vascular Surgery Why: follow up after procedure with bilateral Aterial duplex U/S with ABI'S Contact information: [address] [place] [address] [phone]         Ashley Soulier, DPM Follow up today.   Specialty: Podiatry Why: follow up in 2 weeks (or one week of nursing is unable to monitor your wound) Contact information: [address] [facility] [address] [place] [address] [phone]         Steva Clotilda DEL, NP Follow up.   Specialty: Family Medicine Why: 1 week Contact information: [address] [place] [address] [phone]                   The results of significant diagnostics from this hospitalization (including imaging, microbiology, ancillary and laboratory) are listed below for reference.    Significant Diagnostic Studies: DG MINI C-ARM IMAGE ONLY Result Date: 04/08/2024 There is no interpretation for this exam.  This order is for images obtained during a surgical procedure.  Please See Surgeries Tab for more information regarding the procedure.   PERIPHERAL VASCULAR CATHETERIZATION Result Date: 04/05/2024 See surgical note for result.  US  RENAL Result Date: 04/05/2024 CLINICAL DATA:  [id number] Acute kidney failure (HCC) [id number] EXAM: RENAL / URINARY TRACT ULTRASOUND COMPLETE COMPARISON:  None Available. FINDINGS: Right Kidney: Renal measurements: 10.3 x 5.3 x 6.5 cm = volume: 187 mL. Mildly increased echogenicity. A couple of small cysts present measuring up to 1.3 cm. No hydronephrosis or nephrolithiasis. Left Kidney: Renal measurements: 11.2 x 5.2 x 4.9 cm = volume: 150 mL. Mildly increased echogenicity. Multiple renal cysts, the largest measures 7.8 x 8 x 7.2 cm. No hydronephrosis or nephrolithiasis. Bladder: Appears normal for degree of bladder distention. Other: None. IMPRESSION: 1. No hydronephrosis or nephrolithiasis. 2. Renal parenchymal changes, which can be seen in both acute and chronic medical renal disease. Electronically Signed   By: Rogelia Myers M.D.   On: 04/05/2024 12:37   MR FOOT LEFT W WO CONTRAST Result Date: 04/04/2024 EXAM DESCRIPTION: MR FOOT LEFT W WO CONTRAST CLINICAL HISTORY: Soft tissue infection suspected, foot, xray done COMPARISON: None Available. TECHNIQUE: MRI of the foot is performed according to our usual protocol with multiplanar multi sequence imaging. FINDINGS: There is a large soft tissue wound/ulceration about the distal fourth and fifth digits. Phlegmonous changes in the soft tissues without drainable collection. There is severe marrow edema to the fifth metatarsal head  extending proximally. Areas of cortical destruction to the metatarsal head as well as the proximal and middle phalanx. Findings consistent with osteomyelitis. Suggestion of mild gas in the soft tissues surrounding the fifth metatarsal head. Correlate with x-rays. There is moderate to severe marrow edema on either side of the fourth MTP joint. Associated joint effusion. No definitive cortical erosion/destruction. Findings are suspicious for early osteomyelitis and septic arthritis. The marrow signal is otherwise unremarkable. Moderate atrophy to the plantar musculature. The tendons are unremarkable. Moderate  dorsal subcutaneous edema. IMPRESSION: Large soft tissue wound seen about the distal fourth and fifth digits. Phlegmonous changes without drainable collection. Suspicion of mild gas in the soft tissues about the head of the fifth metatarsal. Correlate with x-rays. Severe marrow edema to the fifth metatarsal head as well as cortical destruction. Probable cortical destruction to the more distal fifth digit as well. Again correlate with x-rays. 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 Moderate to severe marrow edema on either side of the fourth MTP joint with associated moderate joint effusion. This is probably septic arthritis/osteomyelitis. Moderate dorsal subcutaneous edema suggesting cellulitis. Electronically signed by: Reyes Frees MD 04/04/2024 10:51 PM EDT RP Workstation: [id number]   VAS US  ABI WITH/WO TBI Result Date: 03/25/2024  LOWER EXTREMITY DOPPLER STUDY Patient Name:  LORIS WINROW  Date of Exam:   03/23/2024 Medical Rec #: [id number]         Accession #:    [id number] Date of Birth: 1946/02/22         Patient Gender: M Patient Age:   36 years Exam Location:  [facility] Procedure:      VAS US  ABI WITH/WO TBI Referring Phys: --------------------------------------------------------------------------------  Indications: Ulceration. left foot  Performing Technologist: Jerel Croak RVT   Examination Guidelines: A complete evaluation includes at minimum, Doppler waveform signals and systolic blood pressure reading at the level of bilateral brachial, anterior tibial, and posterior tibial arteries, when vessel segments are accessible. Bilateral testing is considered an integral part of a complete examination. Photoelectric Plethysmograph (PPG) waveforms and toe systolic pressure readings are included as required and additional duplex testing as needed. Limited examinations for reoccurring indications may be performed as noted.  ABI Findings: +---------+------------------+-----+--------+--------+ Right    Rt Pressure (mmHg)IndexWaveformComment  +---------+------------------+-----+--------+--------+ Brachial 123                                     +---------+------------------+-----+--------+--------+ PTA      140               1.14 biphasic         +---------+------------------+-----+--------+--------+ DP       127               1.03 biphasic         +---------+------------------+-----+--------+--------+ Great Toe103               0.84 Normal           +---------+------------------+-----+--------+--------+ +---------+------------------+-----+--------+-------+ Left     Lt Pressure (mmHg)IndexWaveformComment +---------+------------------+-----+--------+-------+ PTA      132               1.07 biphasic        +---------+------------------+-----+--------+-------+ DP       136               1.11 biphasic        +---------+------------------+-----+--------+-------+ Great Toe99                0.80 Normal          +---------+------------------+-----+--------+-------+  Summary: Right: Resting right ankle-brachial index is within normal range. The right toe-brachial index is normal. Left: Resting left ankle-brachial index is within normal range. The left toe-brachial index is normal. *See table(s) above for measurements and observations.  Electronically signed  by Selinda Gu MD on 03/25/2024 at 9:01:47 AM.    Final     Microbiology: Recent Results (from  the past 240 hours)  Blood culture (single)     Status: None   Collection Time: 04/04/24  1:43 PM   Specimen: BLOOD  Result Value Ref Range Status   Specimen Description BLOOD RIGHT ANTECUBITAL  Final   Special Requests   Final    BOTTLES DRAWN AEROBIC AND ANAEROBIC Blood Culture results may not be optimal due to an inadequate volume of blood received in culture bottles   Culture   Final    NO GROWTH 5 DAYS Performed at [facility], [address] [place], [place] [address]    Report Status 04/09/2024 FINAL  Final  Blood culture (single)     Status: None   Collection Time: 04/04/24  2:20 PM   Specimen: BLOOD  Result Value Ref Range Status   Specimen Description BLOOD RIGHT ANTECUBITAL  Final   Special Requests   Final    BOTTLES DRAWN AEROBIC AND ANAEROBIC Blood Culture results may not be optimal due to an inadequate volume of blood received in culture bottles   Culture   Final    NO GROWTH 5 DAYS Performed at [facility], [address], [place], [place] [address]    Report Status 04/09/2024 FINAL  Final  Aerobic/Anaerobic Culture w Gram Stain (surgical/deep wound)     Status: None (Preliminary result)   Collection Time: 04/08/24 10:13 AM   Specimen: Foot, Left; Wound  Result Value Ref Range Status   Specimen Description   Final    WOUND Performed at [facility], [address] [place], [place] [address]    Special Requests   Final     LEFT FOOT Performed at [facility], [address] [place], [place] [address]    Gram Stain   Final    RARE WBC SEEN NO EPITHELIAL CELLS SEEN  NO ORGANISMS SEEN Performed at [facility], [address], [place], [place] [address]    Culture   Final    RARE STAPHYLOCOCCUS AUREUS SUSCEPTIBILITIES TO FOLLOW NO ANAEROBES ISOLATED; CULTURE IN PROGRESS FOR 5 DAYS    Report Status PENDING   Incomplete     Labs: Basic Metabolic Panel: Recent Labs  Lab 04/06/24 0518 04/07/24 0251 04/08/24 0451 04/09/24 0531 04/10/24 0449  NA 136 136 139 138 137  K 3.9 3.9 4.1 3.8 3.6  CL 108 108 109 110 110  CO2 19* 19* 21* 19* 20*  GLUCOSE 200* 116* 94 126* 97  BUN 43* 47* 38* 37* 32*  CREATININE 2.77* 2.83* 2.69* 2.35* 2.03*  CALCIUM  8.2* 7.9* 8.4* 7.8* 7.9*  MG 1.9  --   --   --   --    Liver Function Tests: Recent Labs  Lab 04/04/24 1343  AST 51*  ALT 46*  ALKPHOS 116  BILITOT 1.1  PROT 7.4  ALBUMIN 2.7*   No results for input(s): LIPASE, AMYLASE in the last 168 hours. No results for input(s): AMMONIA in the last 168 hours. CBC: Recent Labs  Lab 04/04/24 1343 04/05/24 0437 04/06/24 0518 04/07/24 0251 04/08/24 0451  WBC 11.3* 9.8 14.3* 22.2* 11.4*  NEUTROABS 7.7  --   --   --   --   HGB 12.3* 10.2* 11.9* 10.0* 10.9*  HCT 37.6* 31.0* 36.3* 30.0* 33.5*  MCV 82.1 81.8 80.8 80.9 81.9  PLT 517* 434* 509* 479* 480*   Cardiac Enzymes: No results for input(s): CKTOTAL, CKMB, CKMBINDEX, TROPONINI in the last 168 hours. BNP: BNP (last 3 results) No results for  input(s): BNP in the last 8760 hours.  ProBNP (last 3 results) No results for input(s): PROBNP in the last 8760 hours.  CBG: Recent Labs  Lab 04/09/24 1157 04/09/24 1655 04/09/24 2041 04/10/24 0800 04/10/24 1143  GLUCAP 176* 145* 144* 95 113*       Signed:  Devaughn KATHEE Ban MD.  [facility] 04/10/2024, 4:24 PM

## 2024-04-10 NOTE — Progress Notes (Signed)
 Daily Progress Note   Subjective  - 2 Days Post-Op  Follow-up left foot TMA.  No complaints at this time.  Objective Vitals:   04/09/24 1605 04/09/24 2039 04/10/24 0505 04/10/24 0757  BP: 122/64 130/73 129/81 (!) 147/78  Pulse: 75 76 83 84  Resp: 18 18  16   Temp: 97.7 F (36.5 C) 98.5 F (36.9 C) 97.6 F (36.4 C) 98.1 F (36.7 C)  TempSrc: Oral     SpO2: 99% 99% 96% 95%  Weight:      Height:        Physical Exam: Dressing removed.  Mild bloody drainage along the incision laterally.  Otherwise wound flaps look good.  Good perfusion noted.  No signs of residual infection.  Culture no growth at this time  Laboratory CBC    Component Value Date/Time   WBC 11.4 (H) 04/08/2024 0451   HGB 10.9 (L) 04/08/2024 0451   HGB 12.4 (L) 10/09/2013 0553   HCT 33.5 (L) 04/08/2024 0451   HCT 37.2 (L) 10/09/2013 0553   PLT 480 (H) 04/08/2024 0451   PLT 248 10/09/2013 0553    BMET    Component Value Date/Time   NA 137 04/10/2024 0449   NA 138 10/09/2013 0553   K 3.6 04/10/2024 0449   K 3.9 10/09/2013 0553   CL 110 04/10/2024 0449   CL 103 10/09/2013 0553   CO2 20 (L) 04/10/2024 0449   CO2 30 10/09/2013 0553   GLUCOSE 97 04/10/2024 0449   GLUCOSE 137 (H) 10/09/2013 0553   BUN 32 (H) 04/10/2024 0449   BUN 23 (H) 10/09/2013 0553   CREATININE 2.03 (H) 04/10/2024 0449   CREATININE 1.18 10/09/2013 0553   CALCIUM  7.9 (L) 04/10/2024 0449   CALCIUM  8.9 10/09/2013 0553   GFRNONAA 33 (L) 04/10/2024 0449   GFRNONAA >60 10/09/2013 0553   GFRAA >60 06/07/2017 0557   GFRAA >60 10/09/2013 0553    Assessment/Planning: Gangrene left fifth metatarsal Diabetes with peripheral vascular disease  Likely amputation was therapeutic.  Would recommend 5 days of p.o. broad-spectrum antibiotic (duricef for eg) on discharge. Patient should maintain nonweightbearing is much as possible to the left foot. Recommend 3 times a week dressing changes.  This would be a Betadine  dressing change to the  foot.  Will place in discharge instructions. If home health is able to provide dressing changes patient should follow-up with me in 2 weeks.  If not able to provide will see patient in 1 week for evaluation of incision. Patient stable from podiatry standpoint for discharge.  Ashley Soulier A  04/10/2024, 10:14 AM

## 2024-04-10 NOTE — TOC Transition Note (Signed)
 Transition of Care [facility]) - Discharge Note   Patient Details  Name: Ernest Carpenter MRN: [id number] Date of Birth: [birth year]  [facility] CM/SW Contact:  Marinda Cooks, RN Phone Number: 04/10/2024, 2:59 PM   Clinical Narrative:    This CM updated by covering MD pt medically cleared to dc today and has active DC order . HH & DME arranged pt did not have a preference for agency used. DC transportation confirmed for pt with family  Medical team updated . No additional DC needs requested by medical team or identified by CM at this time .     Final next level of care: Home w Home Health Services Barriers to Discharge: No Barriers Identified     Name of family member notified: Patient Patient and family notified of of transfer: 04/10/24  Discharge Plan and Services Additional resources added to the After Visit Summary for                  DME Arranged: Walker rolling, Other see comment Theme park manager) DME Agency: [employer] Date DME Agency Contacted: 04/10/24 Time DME Agency Contacted: 1440 Representative spoke with at DME Agency: [facility] HH Arranged: RN, PT, OT HH Agency: [facility] Date [facility] Agency Contacted: 04/10/24 Time HH Agency Contacted: 1442 Representative spoke with at [facility] Agency: [place]  Social Drivers of Health (SDOH) Interventions SDOH Screenings   Food Insecurity: No Food Insecurity (04/04/2024)  Recent Concern: Food Insecurity - Food Insecurity Present (02/24/2024)   Received from [facility]  Housing: Low Risk  (04/04/2024)  Transportation Needs: No Transportation Needs (04/04/2024)  Utilities: Not At Risk (04/04/2024)  Financial Resource Strain: Low Risk  (02/24/2024)   Received from [facility]  Physical Activity: Insufficiently Active (10/21/2017)   Received from [facility]  Social Connections: Moderately Integrated (10/21/2017)   Received from [facility]   Stress: No Stress Concern Present (10/21/2017)   Received from [facility]  Tobacco Use: Medium Risk (04/08/2024)     Readmission Risk Interventions     No data to display

## 2024-04-10 NOTE — Discharge Instructions (Signed)
 Dressing changes to left foot: Performed dressing change 3 times a week.  Remove bandage.  Paint incision with Betadine  and apply light Betadine  dressing with bulky gauze dressing, Kerlix, and wrap with Ace wrap.  Patient should remain nonweightbearing to left foot as much as possible.

## 2024-04-10 NOTE — Plan of Care (Signed)
  Problem: Education: Goal: Ability to describe self-care measures that may prevent or decrease complications (Diabetes Survival Skills Education) will improve Outcome: Progressing   Problem: Coping: Goal: Ability to adjust to condition or change in health will improve Outcome: Progressing   Problem: Fluid Volume: Goal: Ability to maintain a balanced intake and output will improve Outcome: Progressing   Problem: Health Behavior/Discharge Planning: Goal: Ability to manage health-related needs will improve Outcome: Progressing   Problem: Nutritional: Goal: Maintenance of adequate nutrition will improve Outcome: Progressing   Problem: Skin Integrity: Goal: Risk for impaired skin integrity will decrease Outcome: Progressing   Problem: Education: Goal: Knowledge of General Education information will improve Description: Including pain rating scale, medication(s)/side effects and non-pharmacologic comfort measures Outcome: Progressing   Problem: Clinical Measurements: Goal: Will remain free from infection Outcome: Progressing

## 2024-04-10 NOTE — Progress Notes (Signed)
 [facility]  ROUNDING NOTE   Subjective:  [name] is a 78 y.o.  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    Objective:  Vital signs in last 24 hours:  Temp:  [97.6 F (36.4 C)-98.5 F (36.9 C)] 98.1 F (36.7 C) (07/06 0757) Pulse Rate:  [75-84] 84 (07/06 0757) Resp:  [16-18] 16 (07/06 0757) BP: (122-147)/(64-81) 147/78 (07/06 0757) SpO2:  [95 %-99 %] 95 % (07/06 0757)  Weight change:  Filed Weights   04/04/24 1339  Weight: 68 kg    Intake/Output: I/O last 3 completed shifts: In: 4193.5 [P.O.:1040; I.V.:3153.5] Out: 2325 [Urine:2325]   Intake/Output this shift:  Total I/O In: -  Out: 400 [Urine:400]  Physical Exam: General: NAD, laying in bed  Head: Normocephalic  Eyes: Anicteric  Neck: Supple  Lungs:  Clear to auscultation, on room air  Heart: Regular rate and rhythm  Abdomen:  Soft, nontender, bowel sounds present  Extremities: Left foot in dressings - clean and dry  Neurologic: Alert  Skin: No lesions  Access: None    Basic Metabolic Panel: Recent Labs  Lab 04/06/24 0518 04/07/24 0251 04/08/24 0451 04/09/24 0531 04/10/24 0449  NA 136 136 139 138 137  K 3.9 3.9 4.1 3.8 3.6  CL 108 108 109 110 110  CO2 19* 19* 21* 19* 20*  GLUCOSE 200* 116* 94 126* 97  BUN 43* 47* 38* 37* 32*  CREATININE 2.77* 2.83* 2.69* 2.35* 2.03*  CALCIUM  8.2* 7.9* 8.4* 7.8* 7.9*  MG 1.9  --   --   --   --     Liver Function Tests: Recent Labs  Lab 04/04/24 1343  AST 51*  ALT 46*  ALKPHOS 116  BILITOT 1.1  PROT 7.4  ALBUMIN 2.7*   No results for  input(s): LIPASE, AMYLASE in the last 168 hours. No results for input(s): AMMONIA in the last 168 hours.  CBC: Recent Labs  Lab 04/04/24 1343 04/05/24 0437 04/06/24 0518 04/07/24 0251 04/08/24 0451  WBC 11.3* 9.8 14.3* 22.2* 11.4*  NEUTROABS 7.7  --   --   --   --   HGB 12.3* 10.2* 11.9* 10.0* 10.9*  HCT 37.6* 31.0* 36.3* 30.0* 33.5*  MCV 82.1 81.8 80.8 80.9 81.9  PLT 517* 434* 509* 479* 480*    Cardiac Enzymes: No results for input(s): CKTOTAL, CKMB, CKMBINDEX, TROPONINI in the last 168 hours.  BNP: Invalid input(s): POCBNP  CBG: Recent Labs  Lab 04/09/24 1157 04/09/24 1655 04/09/24 2041 04/10/24 0800 04/10/24 1143  GLUCAP 176* 145* 144* 95 113*    Microbiology: Results for orders placed or performed during the hospital encounter of 04/04/24  Blood culture (single)     Status: None   Collection Time: 04/04/24  1:43 PM   Specimen: BLOOD  Result Value Ref Range Status   Specimen Description BLOOD RIGHT ANTECUBITAL  Final   Special Requests   Final    BOTTLES DRAWN AEROBIC AND ANAEROBIC Blood Culture results may not be optimal due to an inadequate volume of blood received in culture bottles   Culture   Final    NO GROWTH 5  DAYS Performed at [facility], [address] [place], [place] [address]    Report Status 04/09/2024 FINAL  Final  Blood culture (single)     Status: None   Collection Time: 04/04/24  2:20 PM   Specimen: BLOOD  Result Value Ref Range Status   Specimen Description BLOOD RIGHT ANTECUBITAL  Final   Special Requests   Final    BOTTLES DRAWN AEROBIC AND ANAEROBIC Blood Culture results may not be optimal due to an inadequate volume of blood received in culture bottles   Culture   Final    NO GROWTH 5 DAYS Performed at [facility], [address] [place], [place] [address]    Report Status 04/09/2024 FINAL  Final  Aerobic/Anaerobic Culture w Gram Stain (surgical/deep wound)     Status: None (Preliminary  result)   Collection Time: 04/08/24 10:13 AM   Specimen: Foot, Left; Wound  Result Value Ref Range Status   Specimen Description   Final    WOUND Performed at [facility], [address] [place], [place] [address]    Special Requests   Final     LEFT FOOT Performed at [facility], [address] [place], [place] [address]    Gram Stain   Final    RARE WBC SEEN NO EPITHELIAL CELLS SEEN  NO ORGANISMS SEEN Performed at [facility], [address] [place], [place] [address]    Culture   Final    RARE STAPHYLOCOCCUS AUREUS SUSCEPTIBILITIES TO FOLLOW NO ANAEROBES ISOLATED; CULTURE IN PROGRESS FOR 5 DAYS    Report Status PENDING  Incomplete    Coagulation Studies: No results for input(s): LABPROT, INR in the last 72 hours.  Urinalysis: No results for input(s): COLORURINE, LABSPEC, PHURINE, GLUCOSEU, HGBUR, BILIRUBINUR, KETONESUR, PROTEINUR, UROBILINOGEN, NITRITE, LEUKOCYTESUR in the last 72 hours.  Invalid input(s): APPERANCEUR    Imaging: No results found.    Medications:    sodium chloride  Stopped (04/10/24 0300)    amLODipine   10 mg Oral Daily   amoxicillin -clavulanate  1 tablet Oral BID   aspirin  EC  81 mg Oral Daily   clopidogrel   75 mg Oral Daily   gabapentin   300 mg Oral TID   insulin  aspart  0-15 Units Subcutaneous TID WC   pantoprazole   40 mg Oral BID   rosuvastatin   40 mg Oral QHS   sodium chloride  flush  3 mL Intravenous Q12H   HYDROcodone -acetaminophen , melatonin, ondansetron  **OR** ondansetron  (ZOFRAN ) IV, sodium chloride  flush  Assessment/ Plan:  [name] is a 78 y.o.  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  Acute  kidney injury on chronic kidney disease stage III A.  Baseline creatinine 1.38 with GFR 53 on February 03, 2024.  Acute kidney injury likely secondary to poor oral intake, diuretic/ACE use, and infection.  Renal ultrasound negative for obstruction.  Diuretics and ACE inhibitor's currently held.   Creatinine trending down Lab Results  Component Value Date   CREATININE 2.03 (H) 04/10/2024   CREATININE 2.35 (H) 04/09/2024   CREATININE 2.69 (H) 04/08/2024     Intake/Output Summary (Last 24 hours) at 04/10/2024 1323 Last data filed at 04/10/2024 0716 Gross per 24 hour  Intake 2232.53 ml  Output 2125  ml  Net 107.53 ml      2. Diabetes mellitus type II with chronic kidney disease/renal manifestations:noninsulin dependent. Home regimen includes metformin. Most recent hemoglobin A1c is 7.0 on 04/04/24.    3.  Hypertension with chronic kidney disease.  Home regimen includes lisinopril  and hydrochlorothiazide  which are being held  Currently on amlodipine     4. Anemia of chronic kidney disease.  Normocytic Recent Labs       Lab Results  Component Value Date    HGB 11.9 (L) 04/06/2024    Hemoglobin acceptable at this time.  No immediate need for Epogen.   LOS: 6 Glyn Zendejas 7/6/20251:23 PM

## 2024-04-10 NOTE — Progress Notes (Signed)
 Physical Therapy Treatment Patient Details Name: Ernest Carpenter MRN: [id number] DOB: 04/18/1946 Today's Date: 04/10/2024   History of Present Illness Ernest Carpenter is a 78 y.o. male with medical history significant for coronary artery disease status post CABG and stent angioplasty, diabetes mellitus, hypertension, history of atrial fibrillation, history of PAD who presents to the emergency room from the podiatrist's office for evaluation of gangrene involving the left fifth toe. S/P transmet amputation L foot 7/4. NWB L LE    PT Comments  Pt was pleasant and motivated to participate during the session and put forth good effort throughout.  Pt required no physical assistance during the session and presented with no overt LOB during standing activities.  Pt's family present throughout and participated in training to ensure proper sequencing with below functional tasks for safety and WB compliance. Pt was able to demonstrate good carryover of proper sequencing with transfers, gait, and stair training.  Pt and family's questions answered with all feeling safe and prepared for discharge once medically appropriate.  Pt will benefit from continued PT services upon discharge to safely address deficits listed in patient problem list for decreased caregiver assistance and eventual return to PLOF.       If plan is discharge home, recommend the following: A little help with bathing/dressing/bathroom;Help with stairs or ramp for entrance;Assist for transportation;Assistance with cooking/housework;A little help with walking and/or transfers   Can travel by private vehicle     Yes  Equipment Recommendations  Rolling walker (2 wheels);Other (comment) (transport chair)    Recommendations for Other Services       Precautions / Restrictions Precautions Precautions: Fall Recall of Precautions/Restrictions: Intact Restrictions Weight Bearing Restrictions Per Provider Order: Yes LLE Weight Bearing Per  Provider Order: Non weight bearing Other Position/Activity Restrictions: post op shoe     Mobility  Bed Mobility Overal bed mobility: Modified Independent             General bed mobility comments: Min increased time and effort only    Transfers Overall transfer level: Needs assistance Equipment used: Rolling walker (2 wheels) Transfers: Sit to/from Stand Sit to Stand: Contact guard assist, From elevated surface           General transfer comment: Min to mod multi-modal cues for proper sequencing to ensure WB compliance    Ambulation/Gait Ambulation/Gait assistance: Contact guard assist Gait Distance (Feet): 15 Feet Assistive device: Rolling walker (2 wheels)   Gait velocity: decreased     General Gait Details: Multi-modal cues given for proper sequencing with for hop-to pattern including forwards, backwards, and 180 deg turns within the RW; good stability and WB compliance throughout   Stairs Stairs: Yes Stairs assistance: Contact guard assist Stair Management: With walker, Backwards, Forwards Number of Stairs: 1 General stair comments: Hop-to pattern training ascending backwards and descending forwards with a RW with family present for training on sequencing and proper guarding techniques   Wheelchair Mobility     Tilt Bed    Modified Rankin (Stroke Patients Only)       Balance Overall balance assessment: Needs assistance   Sitting balance-Leahy Scale: Normal     Standing balance support: Bilateral upper extremity supported, During functional activity, Reliant on assistive device for balance Standing balance-Leahy Scale: Fair                              Musician Communication: No apparent difficulties  Cognition Arousal: Alert  Behavior During Therapy: WFL for tasks assessed/performed   PT - Cognitive impairments: No apparent impairments                         Following commands: Intact Following  commands impaired: Follows one step commands with increased time    Cueing Cueing Techniques: Verbal cues, Gestural cues, Tactile cues, Visual cues  Exercises Other Exercises Other Exercises: Car transfer sequencing education provided with pt and family    General Comments        Pertinent Vitals/Pain Pain Assessment Pain Assessment: No/denies pain    Home Living                          Prior Function            PT Goals (current goals can now be found in the care plan section) Progress towards PT goals: Progressing toward goals    Frequency    Min 3X/week      PT Plan      Co-evaluation              AM-PAC PT 6 Clicks Mobility   Outcome Measure  Help needed turning from your back to your side while in a flat bed without using bedrails?: None Help needed moving from lying on your back to sitting on the side of a flat bed without using bedrails?: None Help needed moving to and from a bed to a chair (including a wheelchair)?: A Little Help needed standing up from a chair using your arms (e.g., wheelchair or bedside chair)?: A Little Help needed to walk in hospital room?: A Little Help needed climbing 3-5 steps with a railing? : A Lot 6 Click Score: 19    End of Session Equipment Utilized During Treatment: Gait belt Activity Tolerance: Patient tolerated treatment well Patient left: in chair;with call bell/phone within reach;with chair alarm set;with family/visitor present Nurse Communication: Mobility status;Weight bearing status PT Visit Diagnosis: Other abnormalities of gait and mobility (R26.89);Unsteadiness on feet (R26.81);Difficulty in walking, not elsewhere classified (R26.2)     Time: 8682-8641 PT Time Calculation (min) (ACUTE ONLY): 41 min  Charges:    $Gait Training: 23-37 mins $Therapeutic Activity: 8-22 mins PT General Charges $$ ACUTE PT VISIT: 1 Visit                     D. Scott Taner Rzepka PT, DPT 04/10/24, 3:25  PM

## 2024-04-11 LAB — GLUCOSE, CAPILLARY
Glucose-Capillary: 119 mg/dL — ABNORMAL HIGH (ref 70–99)
Glucose-Capillary: 82 mg/dL (ref 70–99)

## 2024-04-12 ENCOUNTER — Telehealth: Payer: Self-pay | Admitting: Obstetrics and Gynecology

## 2024-04-12 LAB — SURGICAL PATHOLOGY

## 2024-04-12 MED ORDER — CIPROFLOXACIN HCL 500 MG PO TABS: 500.0000 mg | ORAL_TABLET | Freq: Every day | ORAL | 0 refills | Status: AC

## 2024-04-12 NOTE — Telephone Encounter (Signed)
 Surg culture growing mrsa, augmentin  won't cover, called sister and shared, will start renally-dosed cipro , f/u podiatry as scheduled, all questions answered.

## 2024-04-13 LAB — AEROBIC/ANAEROBIC CULTURE W GRAM STAIN (SURGICAL/DEEP WOUND)
